# Patient Record
Sex: Female | Born: 1980 | Race: White | Hispanic: No | Marital: Single | Smoking: Former smoker
Health system: Southern US, Community
[De-identification: ages and names within clinical notes are randomized; demographics above are authoritative.]

## PROBLEM LIST (undated history)

## (undated) DIAGNOSIS — K219 Gastro-esophageal reflux disease without esophagitis: Secondary | ICD-10-CM

## (undated) DIAGNOSIS — I10 Essential (primary) hypertension: Secondary | ICD-10-CM

## (undated) DIAGNOSIS — D649 Anemia, unspecified: Secondary | ICD-10-CM

## (undated) DIAGNOSIS — L732 Hidradenitis suppurativa: Secondary | ICD-10-CM

## (undated) HISTORY — DX: Essential (primary) hypertension: I10

## (undated) HISTORY — DX: Anemia, unspecified: D64.9

## (undated) HISTORY — PX: NO PAST SURGERIES: SHX2092

## (undated) HISTORY — DX: Hidradenitis suppurativa: L73.2

---

## 2005-04-13 ENCOUNTER — Inpatient Hospital Stay (HOSPITAL_COMMUNITY): Admission: RE | Admit: 2005-04-13 | Discharge: 2005-04-18 | Payer: Self-pay | Admitting: Psychiatry

## 2005-04-14 ENCOUNTER — Ambulatory Visit: Payer: Self-pay | Admitting: Psychiatry

## 2005-04-16 ENCOUNTER — Ambulatory Visit: Payer: Self-pay | Admitting: Psychiatry

## 2012-04-10 ENCOUNTER — Encounter: Payer: Self-pay | Admitting: *Deleted

## 2012-04-10 ENCOUNTER — Other Ambulatory Visit: Payer: Self-pay | Admitting: *Deleted

## 2012-04-10 MED ORDER — ETONOGESTREL-ETHINYL ESTRADIOL 0.12-0.015 MG/24HR VA RING: 1.0000 | VAGINAL_RING | VAGINAL | Status: DC

## 2012-04-16 ENCOUNTER — Telehealth: Payer: Self-pay | Admitting: Certified Nurse Midwife

## 2012-04-16 NOTE — Telephone Encounter (Signed)
Pt is returning Fannie Knee call.

## 2012-04-17 MED ORDER — ETONOGESTREL-ETHINYL ESTRADIOL 0.12-0.015 MG/24HR VA RING: 1.0000 | VAGINAL_RING | VAGINAL | Status: DC

## 2012-04-17 NOTE — Telephone Encounter (Signed)
Rx sent to [facility] for Nuvaring Vaginal Ring. sue

## 2012-04-17 NOTE — Telephone Encounter (Signed)
Left message at work # to return call regarding her prescription and insurance.

## 2012-05-16 ENCOUNTER — Encounter: Payer: Self-pay | Admitting: Certified Nurse Midwife

## 2012-05-17 ENCOUNTER — Ambulatory Visit (INDEPENDENT_AMBULATORY_CARE_PROVIDER_SITE_OTHER): Payer: BC Managed Care – PPO | Admitting: Certified Nurse Midwife

## 2012-05-17 ENCOUNTER — Encounter: Payer: Self-pay | Admitting: Certified Nurse Midwife

## 2012-05-17 VITALS — BP 102/70 | Ht 67.0 in | Wt 220.0 lb

## 2012-05-17 DIAGNOSIS — N649 Disorder of breast, unspecified: Secondary | ICD-10-CM

## 2012-05-17 DIAGNOSIS — Z01419 Encounter for gynecological examination (general) (routine) without abnormal findings: Secondary | ICD-10-CM

## 2012-05-17 MED ORDER — ETONOGESTREL-ETHINYL ESTRADIOL 0.12-0.015 MG/24HR VA RING: VAGINAL_RING | VAGINAL | Status: DC

## 2012-05-17 NOTE — Progress Notes (Signed)
32 y.o. G0P0000 Single Caucasian Fe here for annual exam. Periods normal.  Contraception working well. No health issues today.   Patient's last menstrual period was 05/01/2012.          Sexually active: no  The current method of family planning is NuvaRing vaginal inserts.    Exercising: no  exercise Smoker:  no  Health Maintenance: Pap:  05/17/11 neg HPVHR neg MMG:  none Colonoscopy:  none BMD:   none TDaP:  2004 Labs: none Self breast exams: not done   reports that she has quit smoking. She does not have any smokeless tobacco history on file. She reports that she does not drink alcohol or use illicit drugs.  Past Medical History  Diagnosis Date  . Anemia     History reviewed. No pertinent past surgical history.  Current Outpatient Prescriptions  Medication Sig Dispense Refill  . etonogestrel-ethinyl estradiol (NUVARING) 0.12-0.015 MG/24HR vaginal ring Place 1 each vaginally every 28 (twenty-eight) days. Insert vaginally and leave in place for 3 consecutive weeks, then remove for 1 week.  3 each  4  . Fexofenadine HCl (ALLEGRA PO) Take by mouth as needed.       No current facility-administered medications for this visit.    Family History  Problem Relation Age of Onset  . Hypertension Mother   . Diabetes Mother   . Depression Mother   . Depression Sister     ROS:  Pertinent items are noted in HPI.  Otherwise, a comprehensive ROS was negative.  Exam:   BP 102/70  Ht 5\' 7"  (1.702 m)  Wt 220 lb (99.791 kg)  BMI 34.45 kg/m2  LMP 05/01/2012 Height: 5\' 7"  (170.2 cm)  Ht Readings from Last 3 Encounters:  05/17/12 5\' 7"  (1.702 m)    General appearance: alert, cooperative and appears stated age Head: Normocephalic, without obvious abnormality, atraumatic Neck: no adenopathy, supple, symmetrical, trachea midline and thyroid normal to inspection and palpation Lungs: clear to auscultation bilaterally Breasts;:Right breast normal appearance, no masses or tenderness, No  nipple discharge or bleeding, Left breast small irregular shape ?mole or skin lesion at 6 o'clock on left breas, no nipple discharge  Dr. Farrel Gobble evaluated also Heart: regular rate and rhythm Abdomen: soft, non-tender; no masses,  no organomegaly Extremities: extremities normal, atraumatic, no cyanosis or edema Skin: Skin color, texture, turgor normal. No rashes or lesions Lymph nodes: Cervical, supraclavicular, and axillary nodes normal. No abnormal inguinal nodes palpated Neurologic: Grossly normal   Pelvic: External genitalia:  no lesions              Urethra:  normal appearing urethra with no masses, tenderness or lesions              Bartholin's and Skene's: normal                 Vagina: normal appearing vagina with normal color and discharge, no lesions              Cervix: no lesions and normal, non tender              Pap taken: no Bimanual Exam:  Uterus:  normal size, contour, position, consistency, mobility, non-tender              Adnexa: normal adnexa and no mass, fullness, tenderness               Rectovaginal: Confirms               Anus:  normal sphincter tone, no lesions  A:  Well Woman with normal exam Contraception: Nuvaring desired Left breast skin lesion   P: Reviewed health and wellness pertinent to exam   Pap smear as per guidelines  Rx Nuvaring see order Discussed finding and need for Dermatology appointment per recommendation..   Patient request help with this will make referral Dr Farrel Gobble agreeable   return annually or prn  An After Visit Summary was printed and given to the patient.  Reviewed, TL

## 2012-05-17 NOTE — Patient Instructions (Addendum)

## 2012-05-28 ENCOUNTER — Other Ambulatory Visit: Payer: Self-pay | Admitting: Gynecology

## 2012-05-28 ENCOUNTER — Telehealth: Payer: Self-pay | Admitting: *Deleted

## 2012-05-28 DIAGNOSIS — N649 Disorder of breast, unspecified: Secondary | ICD-10-CM

## 2012-05-28 NOTE — Telephone Encounter (Signed)
LM on VM confirming name about appt 06-06-12 at 11:15 arriving at 11:00 to see Alisia Ferrari, PA at [facility]. Gave phone number to call and reschedule if necessary. Pt to call if any questions.

## 2012-05-28 NOTE — Telephone Encounter (Signed)
Referral filled out, please inform

## 2012-05-28 NOTE — Telephone Encounter (Signed)
PATIENT CALLED TODAY TO ASK ABOUT APPOINTMENT TO DERMATOLOGY. SEE OV NOTE 05/17/2012. PLEASE ADVISE. SUE

## 2013-04-12 ENCOUNTER — Other Ambulatory Visit: Payer: Self-pay | Admitting: Obstetrics & Gynecology

## 2013-05-20 ENCOUNTER — Ambulatory Visit (INDEPENDENT_AMBULATORY_CARE_PROVIDER_SITE_OTHER): Payer: BC Managed Care – PPO | Admitting: Certified Nurse Midwife

## 2013-05-20 ENCOUNTER — Encounter: Payer: Self-pay | Admitting: Certified Nurse Midwife

## 2013-05-20 VITALS — BP 110/68 | HR 68 | Resp 16 | Ht 67.25 in | Wt 217.0 lb

## 2013-05-20 DIAGNOSIS — Z0000 Encounter for general adult medical examination without abnormal findings: Secondary | ICD-10-CM

## 2013-05-20 DIAGNOSIS — Z309 Encounter for contraceptive management, unspecified: Secondary | ICD-10-CM

## 2013-05-20 DIAGNOSIS — Z124 Encounter for screening for malignant neoplasm of cervix: Secondary | ICD-10-CM

## 2013-05-20 DIAGNOSIS — Z23 Encounter for immunization: Secondary | ICD-10-CM

## 2013-05-20 DIAGNOSIS — Z01419 Encounter for gynecological examination (general) (routine) without abnormal findings: Secondary | ICD-10-CM

## 2013-05-20 LAB — CBC
HCT: 37.1 % (ref 36.0–46.0)
Hemoglobin: 12.6 g/dL (ref 12.0–15.0)
MCH: 26.7 pg (ref 26.0–34.0)
MCHC: 34 g/dL (ref 30.0–36.0)
MCV: 78.6 fL (ref 78.0–100.0)
PLATELETS: 287 10*3/uL (ref 150–400)
RBC: 4.72 MIL/uL (ref 3.87–5.11)
RDW: 13.8 % (ref 11.5–15.5)
WBC: 7.9 10*3/uL (ref 4.0–10.5)

## 2013-05-20 MED ORDER — ETONOGESTREL-ETHINYL ESTRADIOL 0.12-0.015 MG/24HR VA RING: VAGINAL_RING | VAGINAL | Status: DC

## 2013-05-20 NOTE — Patient Instructions (Signed)
General topics  Next pap or exam is  due in 1 year Take a Women's multivitamin Take 1200 mg. of calcium daily - prefer dietary If any concerns in interim to call back  Breast Self-Awareness Practicing breast self-awareness may pick up problems early, prevent significant medical complications, and possibly save your life. By practicing breast self-awareness, you can become familiar with how your breasts look and feel and if your breasts are changing. This allows you to notice changes early. It can also offer you some reassurance that your breast health is good. One way to learn what is normal for your breasts and whether your breasts are changing is to do a breast self-exam. If you find a lump or something that was not present in the past, it is best to contact your caregiver right away. Other findings that should be evaluated by your caregiver include nipple discharge, especially if it is bloody; skin changes or reddening; areas where the skin seems to be pulled in (retracted); or new lumps and bumps. Breast pain is seldom associated with cancer (malignancy), but should also be evaluated by a caregiver. BREAST SELF-EXAM The best time to examine your breasts is 5 7 days after your menstrual period is over.  ExitCare Patient Information 2013 [employer].   Exercise to Stay Healthy Exercise helps you become and stay healthy. EXERCISE IDEAS AND TIPS Choose exercises that:  You enjoy.  Fit into your day. You do not need to exercise really hard to be healthy. You can do exercises at a slow or medium level and stay healthy. You can:  Stretch before and after working out.  Try yoga, Pilates, or tai chi.  Lift weights.  Walk fast, swim, jog, run, climb stairs, bicycle, dance, or rollerskate.  Take aerobic classes. Exercises that burn about 150 calories:  Running 1  miles in 15 minutes.  Playing volleyball for 45 to 60 minutes.  Washing and waxing a car for 45 to 60  minutes.  Playing touch football for 45 minutes.  Walking 1  miles in 35 minutes.  Pushing a stroller 1  miles in 30 minutes.  Playing basketball for 30 minutes.  Raking leaves for 30 minutes.  Bicycling 5 miles in 30 minutes.  Walking 2 miles in 30 minutes.  Dancing for 30 minutes.  Shoveling snow for 15 minutes.  Swimming laps for 20 minutes.  Walking up stairs for 15 minutes.  Bicycling 4 miles in 15 minutes.  Gardening for 30 to 45 minutes.  Jumping rope for 15 minutes.  Washing windows or floors for 45 to 60 minutes. Document Released: 02/04/2010 Document Revised: 03/27/2011 Document Reviewed: 02/04/2010 ExitCare Patient Information 2013 [employer].   Other topics ( that may be useful information):    Sexually Transmitted Disease Sexually transmitted disease (STD) refers to any infection that is passed from person to person during sexual activity. This may happen by way of saliva, semen, blood, vaginal mucus, or urine. Common STDs include:  Gonorrhea.  Chlamydia.  Syphilis.  HIV/AIDS.  Genital herpes.  Hepatitis B and C.  Trichomonas.  Human papillomavirus (HPV).  Pubic lice. CAUSES  An STD may be spread by bacteria, virus, or parasite. A person can get an STD by:  Sexual intercourse with an infected person.  Sharing sex toys with an infected person.  Sharing needles with an infected person.  Having intimate contact with the genitals, mouth, or rectal areas of an infected person. SYMPTOMS  Some people may not have any symptoms, but   they can still pass the infection to others. Different STDs have different symptoms. Symptoms include:  Painful or bloody urination.  Pain in the pelvis, abdomen, vagina, anus, throat, or eyes.  Skin rash, itching, irritation, growths, or sores (lesions). These usually occur in the genital or anal area.  Abnormal vaginal discharge.  Penile discharge in men.  Soft, flesh-colored skin growths in the  genital or anal area.  Fever.  Pain or bleeding during sexual intercourse.  Swollen glands in the groin area.  Yellow skin and eyes (jaundice). This is seen with hepatitis. DIAGNOSIS  To make a diagnosis, your caregiver may:  Take a medical history.  Perform a physical exam.  Take a specimen (culture) to be examined.  Examine a sample of discharge under a microscope.  Perform blood test TREATMENT   Chlamydia, gonorrhea, trichomonas, and syphilis can be cured with antibiotic medicine.  Genital herpes, hepatitis, and HIV can be treated, but not cured, with prescribed medicines. The medicines will lessen the symptoms.  Genital warts from HPV can be treated with medicine or by freezing, burning (electrocautery), or surgery. Warts may come back.  HPV is a virus and cannot be cured with medicine or surgery.However, abnormal areas may be followed very closely by your caregiver and may be removed from the cervix, vagina, or vulva through office procedures or surgery. If your diagnosis is confirmed, your recent sexual partners need treatment. This is true even if they are symptom-free or have a negative culture or evaluation. They should not have sex until their caregiver says it is okay. HOME CARE INSTRUCTIONS  All sexual partners should be informed, tested, and treated for all STDs.  Take your antibiotics as directed. Finish them even if you start to feel better.  Only take over-the-counter or prescription medicines for pain, discomfort, or fever as directed by your caregiver.  Rest.  Eat a balanced diet and drink enough fluids to keep your urine clear or pale yellow.  Do not have sex until treatment is completed and you have followed up with your caregiver. STDs should be checked after treatment.  Keep all follow-up appointments, Pap tests, and blood tests as directed by your caregiver.  Only use latex condoms and water-soluble lubricants during sexual activity. Do not use  petroleum jelly or oils.  Avoid alcohol and illegal drugs.  Get vaccinated for HPV and hepatitis. If you have not received these vaccines in the past, talk to your caregiver about whether one or both might be right for you.  Avoid risky sex practices that can break the skin. The only way to avoid getting an STD is to avoid all sexual activity.Latex condoms and dental dams (for oral sex) will help lessen the risk of getting an STD, but will not completely eliminate the risk. SEEK MEDICAL CARE IF:   You have a fever.  You have any new or worsening symptoms. Document Released: 03/25/2002 Document Revised: 03/27/2011 Document Reviewed: 04/01/2010 [facility] Patient Information 2013 [employer].    Domestic Abuse You are being battered or abused if someone close to you hits, pushes, or physically hurts you in any way. You also are being abused if you are forced into activities. You are being sexually abused if you are forced to have sexual contact of any kind. You are being emotionally abused if you are made to feel worthless or if you are constantly threatened. It is important to remember that help is available. No one has the right to abuse you. PREVENTION OF FURTHER  ABUSE  Learn the warning signs of danger. This varies with situations but may include: the use of alcohol, threats, isolation from friends and family, or forced sexual contact. Leave if you feel that violence is going to occur.  If you are attacked or beaten, report it to the police so the abuse is documented. You do not have to press charges. The police can protect you while you or the attackers are leaving. Get the officer's name and badge number and a copy of the report.  Find someone you can trust and tell them what is happening to you: your caregiver, a nurse, clergy member, close friend or family member. Feeling ashamed is natural, but remember that you have done nothing wrong. No one deserves abuse. Document Released:  12/31/1999 Document Revised: 03/27/2011 Document Reviewed: 03/10/2010 ExitCare Patient Information 2013 [employer].    How Much is Too Much Alcohol? Drinking too much alcohol can cause injury, accidents, and health problems. These types of problems can include:   Car crashes.  Falls.  Family fighting (domestic violence).  Drowning.  Fights.  Injuries.  Burns.  Damage to certain organs.  Having a baby with birth defects. ONE DRINK CAN BE TOO MUCH WHEN YOU ARE:  Working.  Pregnant or breastfeeding.  Taking medicines. Ask your doctor.  Driving or planning to drive. If you or someone you know has a drinking problem, get help from a doctor.  Document Released: 10/29/2008 Document Revised: 03/27/2011 Document Reviewed: 10/29/2008 ExitCare Patient Information 2013 [employer].   Smoking Hazards Smoking cigarettes is extremely bad for your health. Tobacco smoke has over 200 known poisons in it. There are over 60 chemicals in tobacco smoke that cause cancer. Some of the chemicals found in cigarette smoke include:   Cyanide.  Benzene.  Formaldehyde.  Methanol (wood alcohol).  Acetylene (fuel used in welding torches).  Ammonia. Cigarette smoke also contains the poisonous gases nitrogen oxide and carbon monoxide.  Cigarette smokers have an increased risk of many serious medical problems and Smoking causes approximately:  90% of all lung cancer deaths in men.  80% of all lung cancer deaths in women.  90% of deaths from chronic obstructive lung disease. Compared with nonsmokers, smoking increases the risk of:  Coronary heart disease by 2 to 4 times.  Stroke by 2 to 4 times.  Men developing lung cancer by 23 times.  Women developing lung cancer by 13 times.  Dying from chronic obstructive lung diseases by 12 times.  . Smoking is the most preventable cause of death and disease in our society.  WHY IS SMOKING ADDICTIVE?  Nicotine is the chemical  agent in tobacco that is capable of causing addiction or dependence.  When you smoke and inhale, nicotine is absorbed rapidly into the bloodstream through your lungs. Nicotine absorbed through the lungs is capable of creating a powerful addiction. Both inhaled and non-inhaled nicotine may be addictive.  Addiction studies of cigarettes and spit tobacco show that addiction to nicotine occurs mainly during the teen years, when young people begin using tobacco products. WHAT ARE THE BENEFITS OF QUITTING?  There are many health benefits to quitting smoking.   Likelihood of developing cancer and heart disease decreases. Health improvements are seen almost immediately.  Blood pressure, pulse rate, and breathing patterns start returning to normal soon after quitting. QUITTING SMOKING   American Lung Association - 1-800-LUNGUSA  American Cancer Society - 1-800-ACS-2345 Document Released: 02/10/2004 Document Revised: 03/27/2011 Document Reviewed: 10/14/2008 ExitCare Patient Information 2013 ExitCare[employer]   LLC.   Stress Management Stress is a state of physical or mental tension that often results from changes in your life or normal routine. Some common causes of stress are:  Death of a loved one.  Injuries or severe illnesses.  Getting fired or changing jobs.  Moving into a new home. Other causes may be:  Sexual problems.  Business or financial losses.  Taking on a large debt.  Regular conflict with someone at home or at work.  Constant tiredness from lack of sleep. It is not just bad things that are stressful. It may be stressful to:  Win the lottery.  Get married.  Buy a new car. The amount of stress that can be easily tolerated varies from person to person. Changes generally cause stress, regardless of the types of change. Too much stress can affect your health. It may lead to physical or emotional problems. Too little stress (boredom) may also become stressful. SUGGESTIONS TO  REDUCE STRESS:  Talk things over with your family and friends. It often is helpful to share your concerns and worries. If you feel your problem is serious, you may want to get help from a professional counselor.  Consider your problems one at a time instead of lumping them all together. Trying to take care of everything at once may seem impossible. List all the things you need to do and then start with the most important one. Set a goal to accomplish 2 or 3 things each day. If you expect to do too many in a single day you will naturally fail, causing you to feel even more stressed.  Do not use alcohol or drugs to relieve stress. Although you may feel better for a short time, they do not remove the problems that caused the stress. They can also be habit forming.  Exercise regularly - at least 3 times per week. Physical exercise can help to relieve that "uptight" feeling and will relax you.  The shortest distance between despair and hope is often a good night's sleep.  Go to bed and get up on time allowing yourself time for appointments without being rushed.  Take a short "time-out" period from any stressful situation that occurs during the day. Close your eyes and take some deep breaths. Starting with the muscles in your face, tense them, hold it for a few seconds, then relax. Repeat this with the muscles in your neck, shoulders, hand, stomach, back and legs.  Take good care of yourself. Eat a balanced diet and get plenty of rest.  Schedule time for having fun. Take a break from your daily routine to relax. HOME CARE INSTRUCTIONS   Call if you feel overwhelmed by your problems and feel you can no longer manage them on your own.  Return immediately if you feel like hurting yourself or someone else. Document Released: 06/28/2000 Document Revised: 03/27/2011 Document Reviewed: 02/18/2007 ExitCare Patient Information 2013 [employer]Care[employer].   

## 2013-05-20 NOTE — Progress Notes (Signed)
Reviewed personally.  M. Suzanne Kemoni Ortega, MD.  

## 2013-05-20 NOTE — Progress Notes (Signed)
33 y.o. G0P0000 Single Caucasian Fe here for annual exam. Periods normal, no problems. [place] working well for cycle control. Mother type 2 diabetic, patient working on weight loss, to decrease her risk. No health issues. Busy with her "job".  Patient's last menstrual period was 05/01/2013.          Sexually active: no  The current method of family planning is NuvaRing vaginal inserts.    Exercising: no  exercise Smoker:  no  Health Maintenance: Pap: 05-17-11 neg HPV HR neg MMG:  none Colonoscopy:  none BMD:   none TDaP: 12/17/02 Labs: none Self breast exam: not done   reports that she has quit smoking. She does not have any smokeless tobacco history on file. She reports that she does not drink alcohol or use illicit drugs.  Past Medical History  Diagnosis Date  . Anemia     History reviewed. No pertinent past surgical history.  Current Outpatient Prescriptions  Medication Sig Dispense Refill  . etonogestrel-ethinyl estradiol (NUVARING) 0.12-0.015 MG/24HR vaginal ring Place 1 each vaginally every 28 (twenty-eight) days. Insert vaginally and leave in place for 3 consecutive weeks, then remove for 1 week.      Marland Kitchen Fexofenadine HCl (ALLEGRA PO) Take by mouth as needed.      . Nutritional Supplements (JUICE PLUS FIBRE PO) Take by mouth daily.       No current facility-administered medications for this visit.    Family History  Problem Relation Age of Onset  . Hypertension Mother   . Diabetes Mother   . Depression Mother   . Depression Sister   . Cancer Maternal Aunt   . Heart attack Maternal Uncle     ROS:  Pertinent items are noted in HPI.  Otherwise, a comprehensive ROS was negative.  Exam:   BP 110/68  Pulse 68  Resp 16  Ht 5' 7.25" (1.708 m)  Wt 217 lb (98.431 kg)  BMI 33.74 kg/m2  LMP 05/01/2013 Height: 5' 7.25" (170.8 cm)  Ht Readings from Last 3 Encounters:  05/20/13 5' 7.25" (1.708 m)  05/17/12 5\' 7"  (1.702 m)    General appearance: alert, cooperative and  appears stated age Head: Normocephalic, without obvious abnormality, atraumatic Neck: no adenopathy, supple, symmetrical, trachea midline and thyroid normal to inspection and palpation and non-palpable Lungs: clear to auscultation bilaterally Breasts: normal appearance, no masses or tenderness, No nipple retraction or dimpling, No nipple discharge or bleeding, No axillary or supraclavicular adenopathy Heart: regular rate and rhythm Abdomen: soft, non-tender; no masses,  no organomegaly Extremities: extremities normal, atraumatic, no cyanosis or edema Skin: Skin color, texture, turgor normal. No rashes or lesions Lymph nodes: Cervical, supraclavicular, and axillary nodes normal. No abnormal inguinal nodes palpated Neurologic: Grossly normal   Pelvic: External genitalia:  no lesions              Urethra:  normal appearing urethra with no masses, tenderness or lesions              Bartholin's and Skene's: normal                 Vagina: normal appearing vagina with normal color and discharge, no lesions              Cervix: normal, non tender              Pap taken: yes Bimanual Exam:  Uterus:  normal size, contour, position, consistency, mobility, non-tender and anteverted  Adnexa: normal adnexa, no masses               Rectovaginal: Confirms               Anus:  normal sphincter tone, no lesions  A:  Well Woman with normal exam  History of dysmenorrhea, Nuvaring working well for cycle control, desires continuance  Screening labs  Immunization update  P:   Reviewed health and wellness pertinent to exam  Rx Nuvaring see order  Labs:CBC,Lipid panel,TSH, Hgb A1-c  Pap smear taken today with HPV reflex  Requests TDAP   counseled on breast self exam, STD prevention, adequate intake of calcium and vitamin D, diet and exercise  return annually or prn  An After Visit Summary was printed and given to the patient.

## 2013-05-21 LAB — HEMOGLOBIN A1C
Hgb A1c MFr Bld: 5.5 % (ref <5.7)
Mean Plasma Glucose: 111 mg/dL (ref <117)

## 2013-05-21 LAB — LIPID PANEL
Cholesterol: 211 mg/dL — ABNORMAL HIGH (ref 0–200)
HDL: 73 mg/dL (ref >39)
LDL CALC: 119 mg/dL — AB (ref 0–99)
Total CHOL/HDL Ratio: 2.9 Ratio
Triglycerides: 93 mg/dL (ref <150)
VLDL: 19 mg/dL (ref 0–40)

## 2013-05-21 LAB — TSH: TSH: 1.737 u[IU]/mL (ref 0.350–4.500)

## 2013-05-23 LAB — IPS PAP TEST WITH REFLEX TO HPV

## 2014-05-26 ENCOUNTER — Ambulatory Visit (INDEPENDENT_AMBULATORY_CARE_PROVIDER_SITE_OTHER): Payer: BLUE CROSS/BLUE SHIELD | Admitting: Certified Nurse Midwife

## 2014-05-26 ENCOUNTER — Encounter: Payer: Self-pay | Admitting: Certified Nurse Midwife

## 2014-05-26 VITALS — BP 110/72 | HR 70 | Resp 20 | Ht 67.25 in | Wt 239.0 lb

## 2014-05-26 DIAGNOSIS — Z0000 Encounter for general adult medical examination without abnormal findings: Secondary | ICD-10-CM | POA: Diagnosis not present

## 2014-05-26 DIAGNOSIS — Z01419 Encounter for gynecological examination (general) (routine) without abnormal findings: Secondary | ICD-10-CM

## 2014-05-26 DIAGNOSIS — Z3049 Encounter for surveillance of other contraceptives: Secondary | ICD-10-CM | POA: Diagnosis not present

## 2014-05-26 DIAGNOSIS — R899 Unspecified abnormal finding in specimens from other organs, systems and tissues: Secondary | ICD-10-CM

## 2014-05-26 DIAGNOSIS — Z124 Encounter for screening for malignant neoplasm of cervix: Secondary | ICD-10-CM | POA: Diagnosis not present

## 2014-05-26 LAB — COMPREHENSIVE METABOLIC PANEL
ALBUMIN: 3.8 g/dL (ref 3.5–5.2)
ALT: 13 U/L (ref 0–35)
AST: 9 U/L (ref 0–37)
Alkaline Phosphatase: 71 U/L (ref 39–117)
BILIRUBIN TOTAL: 0.5 mg/dL (ref 0.2–1.2)
BUN: 16 mg/dL (ref 6–23)
CO2: 22 mEq/L (ref 19–32)
Calcium: 9.4 mg/dL (ref 8.4–10.5)
Chloride: 104 mEq/L (ref 96–112)
Creat: 0.72 mg/dL (ref 0.50–1.10)
GLUCOSE: 80 mg/dL (ref 70–99)
Potassium: 4.3 mEq/L (ref 3.5–5.3)
Sodium: 140 mEq/L (ref 135–145)
Total Protein: 6.9 g/dL (ref 6.0–8.3)

## 2014-05-26 LAB — HEMOGLOBIN A1C
HEMOGLOBIN A1C: 5.6 % (ref <5.7)
MEAN PLASMA GLUCOSE: 114 mg/dL (ref <117)

## 2014-05-26 LAB — POCT URINALYSIS DIPSTICK
Bilirubin, UA: NEGATIVE
Glucose, UA: NEGATIVE
KETONES UA: NEGATIVE
Leukocytes, UA: NEGATIVE
Nitrite, UA: NEGATIVE
PH UA: 5
Protein, UA: NEGATIVE
RBC UA: NEGATIVE
UROBILINOGEN UA: NEGATIVE

## 2014-05-26 LAB — CBC
HCT: 40 % (ref 36.0–46.0)
HEMOGLOBIN: 13.1 g/dL (ref 12.0–15.0)
MCH: 26.6 pg (ref 26.0–34.0)
MCHC: 32.8 g/dL (ref 30.0–36.0)
MCV: 81.3 fL (ref 78.0–100.0)
MPV: 10.4 fL (ref 8.6–12.4)
Platelets: 319 10*3/uL (ref 150–400)
RBC: 4.92 MIL/uL (ref 3.87–5.11)
RDW: 14.4 % (ref 11.5–15.5)
WBC: 12.4 10*3/uL — ABNORMAL HIGH (ref 4.0–10.5)

## 2014-05-26 LAB — LIPID PANEL
Cholesterol: 227 mg/dL — ABNORMAL HIGH (ref 0–200)
HDL: 72 mg/dL (ref >=46)
LDL CALC: 138 mg/dL — AB (ref 0–99)
Total CHOL/HDL Ratio: 3.2 Ratio
Triglycerides: 85 mg/dL (ref <150)
VLDL: 17 mg/dL (ref 0–40)

## 2014-05-26 MED ORDER — ETONOGESTREL-ETHINYL ESTRADIOL 0.12-0.015 MG/24HR VA RING: VAGINAL_RING | VAGINAL | Status: DC

## 2014-05-26 NOTE — Patient Instructions (Signed)
EXERCISE AND DIET:  We recommended that you start or continue a regular exercise program for good health. Regular exercise means any activity that makes your heart beat faster and makes you sweat.  We recommend exercising at least 30 minutes per day at least 3 days a week, preferably 4 or 5.  We also recommend a diet low in fat and sugar.  Inactivity, poor dietary choices and obesity can cause diabetes, heart attack, stroke, and kidney damage, among others.    ALCOHOL AND SMOKING:  Women should limit their alcohol intake to no more than 7 drinks/beers/glasses of wine (combined, not each!) per week. Moderation of alcohol intake to this level decreases your risk of breast cancer and liver damage. And of course, no recreational drugs are part of a healthy lifestyle.  And absolutely no smoking or even second hand smoke. Most people know smoking can cause heart and lung diseases, but did you know it also contributes to weakening of your bones? Aging of your skin?  Yellowing of your teeth and nails?  CALCIUM AND VITAMIN D:  Adequate intake of calcium and Vitamin D are recommended.  The recommendations for exact amounts of these supplements seem to change often, but generally speaking 600 mg of calcium (either carbonate or citrate) and 800 units of Vitamin D per day seems prudent. Certain women may benefit from higher intake of Vitamin D.  If you are among these women, your doctor will have told you during your visit.    PAP SMEARS:  Pap smears, to check for cervical cancer or precancers,  have traditionally been done yearly, although recent scientific advances have shown that most women can have pap smears less often.  However, every woman still should have a physical exam from her gynecologist every year. It will include a breast check, inspection of the vulva and vagina to check for abnormal growths or skin changes, a visual exam of the cervix, and then an exam to evaluate the size and shape of the uterus and  ovaries.  And after 36 years of age, a rectal exam is indicated to check for rectal cancers. We will also provide age appropriate advice regarding health maintenance, like when you should have certain vaccines, screening for sexually transmitted diseases, bone density testing, colonoscopy, mammograms, etc.   MAMMOGRAMS:  All women over 40 years old should have a yearly mammogram. Many facilities now offer a "3D" mammogram, which may cost around $50 extra out of pocket. If possible,  we recommend you accept the option to have the 3D mammogram performed.  It both reduces the number of women who will be called back for extra views which then turn out to be normal, and it is better than the routine mammogram at detecting truly abnormal areas.    COLONOSCOPY:  Colonoscopy to screen for colon cancer is recommended for all women at age 50.  We know, you hate the idea of the prep.  We agree, BUT, having colon cancer and not knowing it is worse!!  Colon cancer so often starts as a polyp that can be seen and removed at colonscopy, which can quite literally save your life!  And if your first colonoscopy is normal and you have no family history of colon cancer, most women don't have to have it again for 10 years.  Once every ten years, you can do something that may end up saving your life, right?  We will be happy to help you get it scheduled when you are ready.    Be sure to check your insurance coverage so you understand how much it will cost.  It may be covered as a preventative service at no cost, but you should check your particular policy.     Exercise to Lose Weight Exercise and a healthy diet may help you lose weight. Your doctor may suggest specific exercises. EXERCISE IDEAS AND TIPS  Choose low-cost things you enjoy doing, such as walking, bicycling, or exercising to workout videos.  Take stairs instead of the elevator.  Walk during your lunch break.  Park your car further away from work or  school.  Go to a gym or an exercise class.  Start with 5 to 10 minutes of exercise each day. Build up to 30 minutes of exercise 4 to 6 days a week.  Wear shoes with good support and comfortable clothes.  Stretch before and after working out.  Work out until you breathe harder and your heart beats faster.  Drink extra water when you exercise.  Do not do so much that you hurt yourself, feel dizzy, or get very short of breath. Exercises that burn about 150 calories:  Running 1  miles in 15 minutes.  Playing volleyball for 45 to 60 minutes.  Washing and waxing a car for 45 to 60 minutes.  Playing touch football for 45 minutes.  Walking 1  miles in 35 minutes.  Pushing a stroller 1  miles in 30 minutes.  Playing basketball for 30 minutes.  Raking leaves for 30 minutes.  Bicycling 5 miles in 30 minutes.  Walking 2 miles in 30 minutes.  Dancing for 30 minutes.  Shoveling snow for 15 minutes.  Swimming laps for 20 minutes.  Walking up stairs for 15 minutes.  Bicycling 4 miles in 15 minutes.  Gardening for 30 to 45 minutes.  Jumping rope for 15 minutes.  Washing windows or floors for 45 to 60 minutes. Document Released: 02/04/2010 Document Revised: 03/27/2011 Document Reviewed: 02/04/2010 [facility] Patient Information 2015 [place], [place]. This information is not intended to replace advice given to you by your health care provider. Make sure you discuss any questions you have with your health care provider.

## 2014-05-26 NOTE — Progress Notes (Signed)
34 y.o. G0P0000 Single  Caucasian Fe here for annual exam. Contraception working well. Periods now 3 days from 11 days duration" loves Nuvaring". Never sexually active.  Sees Urgent care if needed. No health issue in past year. Patient knows she has gained weight and plans to go on [place] program with her mom. No health issues today.  Patient's last menstrual period was 05/01/2014.          Sexually active: No.  The current method of family planning is NuvaRing vaginal inserts.    Exercising: No.  exercise Smoker:  no  Health Maintenance: Pap: 05-20-13 neg MMG:  none Colonoscopy:  none BMD:   none TDaP:  2015 Labs: not done   reports that she has quit smoking. She does not have any smokeless tobacco history on file. She reports that she does not drink alcohol or use illicit drugs.  Past Medical History  Diagnosis Date  . Anemia     History reviewed. No pertinent past surgical history.  Current Outpatient Prescriptions  Medication Sig Dispense Refill  . etonogestrel-ethinyl estradiol (NUVARING) 0.12-0.015 MG/24HR vaginal ring Insert vaginally and leave in place for 3 consecutive weeks, then remove for 1 week. 3 each 4  . UNABLE TO FIND daily. Allergy nasal spray     No current facility-administered medications for this visit.    Family History  Problem Relation Age of Onset  . Hypertension Mother   . Diabetes Mother   . Depression Mother   . Depression Sister   . Cancer Maternal Aunt   . Heart attack Maternal Uncle     ROS:  Pertinent items are noted in HPI.  Otherwise, a comprehensive ROS was negative.  Exam:   BP 110/72 mmHg  Pulse 70  Resp 20  Ht 5' 7.25" (1.708 m)  Wt 239 lb (108.41 kg)  BMI 37.16 kg/m2  LMP 05/01/2014 Height: 5' 7.25" (170.8 cm) Ht Readings from Last 3 Encounters:  05/26/14 5' 7.25" (1.708 m)  05/20/13 5' 7.25" (1.708 m)  05/17/12 5\' 7"  (1.702 m)    General appearance: alert, cooperative and appears stated age Head: Normocephalic,  without obvious abnormality, atraumatic Neck: no adenopathy, supple, symmetrical, trachea midline and thyroid normal to inspection and palpation Lungs: clear to auscultation bilaterally Breasts: normal appearance, no masses or tenderness, No nipple retraction or dimpling, No nipple discharge or bleeding, No axillary or supraclavicular adenopathy Heart: regular rate and rhythm Abdomen: soft, non-tender; no masses,  no organomegaly Extremities: extremities normal, atraumatic, no cyanosis or edema Skin: Skin color, texture, turgor normal. No rashes or lesions Lymph nodes: Cervical, supraclavicular, and axillary nodes normal. No abnormal inguinal nodes palpated Neurologic: Grossly normal   Pelvic: External genitalia:  no lesions              Urethra:  normal appearing urethra with no masses, tenderness or lesions              Bartholin's and Skene's: normal                 Vagina: normal appearing vagina with normal color and discharge, no lesions              Cervix: normal,non tender, no lesions              Pap taken: Yes.   Bimanual Exam:  Uterus:  normal size, contour, position, consistency, mobility, non-tender              Adnexa: normal adnexa and no mass,  fullness, tenderness               Rectovaginal: Confirms               Anus:  normal sphincter tone, no lesions  Chaperone present: Yes  A:  Well Woman with normal exam  Contraception Nuvaring desired  Fasting labs  Morbid obesity, starting on Nutrasystem with mother  P:   Reviewed health and wellness pertinent to exam  Rx Nuvaring see order  Lab CMP, Lipid panel, Hgb A1-c, CBC, Vitamin D  Stressed importance of weight loss and prevention of other health issues.  Pap smear taken today with HPVHR   counseled on breast self exam, adequate intake of calcium and vitamin D, diet and exercise  return annually or prn  An After Visit Summary was printed and given to the patient.

## 2014-05-27 ENCOUNTER — Other Ambulatory Visit: Payer: Self-pay | Admitting: Certified Nurse Midwife

## 2014-05-27 LAB — VITAMIN D 25 HYDROXY (VIT D DEFICIENCY, FRACTURES): VIT D 25 HYDROXY: 21 ng/mL — AB (ref 30–100)

## 2014-05-27 NOTE — Telephone Encounter (Signed)
05/26/14 #3 packs/4 rfs was sent to [facility]- Denied.

## 2014-06-01 LAB — IPS PAP TEST WITH HPV

## 2014-06-01 NOTE — Progress Notes (Signed)
Reviewed personally.  M. Suzanne Velvia Mehrer, MD.  

## 2014-06-01 NOTE — Addendum Note (Signed)
Addended by: Regina Eck on: 06/01/2014 06:05 PM   Modules accepted: Miquel Dunn

## 2014-06-04 ENCOUNTER — Other Ambulatory Visit (INDEPENDENT_AMBULATORY_CARE_PROVIDER_SITE_OTHER): Payer: BLUE CROSS/BLUE SHIELD

## 2014-06-04 DIAGNOSIS — R899 Unspecified abnormal finding in specimens from other organs, systems and tissues: Secondary | ICD-10-CM

## 2014-06-05 LAB — CBC WITH DIFFERENTIAL/PLATELET
BASOS ABS: 0 10*3/uL (ref 0.0–0.1)
BASOS PCT: 0 % (ref 0–1)
EOS ABS: 0.1 10*3/uL (ref 0.0–0.7)
Eosinophils Relative: 1 % (ref 0–5)
HEMATOCRIT: 35.4 % — AB (ref 36.0–46.0)
Hemoglobin: 11.7 g/dL — ABNORMAL LOW (ref 12.0–15.0)
Lymphocytes Relative: 23 % (ref 12–46)
Lymphs Abs: 2.4 10*3/uL (ref 0.7–4.0)
MCH: 26.1 pg (ref 26.0–34.0)
MCHC: 33.1 g/dL (ref 30.0–36.0)
MCV: 79 fL (ref 78.0–100.0)
MONO ABS: 0.6 10*3/uL (ref 0.1–1.0)
MONOS PCT: 6 % (ref 3–12)
MPV: 10.5 fL (ref 8.6–12.4)
NEUTROS ABS: 7.4 10*3/uL (ref 1.7–7.7)
Neutrophils Relative %: 70 % (ref 43–77)
PLATELETS: 279 10*3/uL (ref 150–400)
RBC: 4.48 MIL/uL (ref 3.87–5.11)
RDW: 14.2 % (ref 11.5–15.5)
WBC: 10.6 10*3/uL — AB (ref 4.0–10.5)

## 2014-06-08 ENCOUNTER — Other Ambulatory Visit: Payer: Self-pay | Admitting: Certified Nurse Midwife

## 2014-06-08 DIAGNOSIS — R899 Unspecified abnormal finding in specimens from other organs, systems and tissues: Secondary | ICD-10-CM

## 2014-07-08 ENCOUNTER — Other Ambulatory Visit (INDEPENDENT_AMBULATORY_CARE_PROVIDER_SITE_OTHER): Payer: BLUE CROSS/BLUE SHIELD

## 2014-07-08 DIAGNOSIS — R899 Unspecified abnormal finding in specimens from other organs, systems and tissues: Secondary | ICD-10-CM

## 2014-07-08 LAB — CBC WITH DIFFERENTIAL/PLATELET
BASOS PCT: 0 % (ref 0–1)
Basophils Absolute: 0 10*3/uL (ref 0.0–0.1)
EOS ABS: 0.1 10*3/uL (ref 0.0–0.7)
EOS PCT: 1 % (ref 0–5)
HEMATOCRIT: 37.7 % (ref 36.0–46.0)
Hemoglobin: 12.3 g/dL (ref 12.0–15.0)
Lymphocytes Relative: 25 % (ref 12–46)
Lymphs Abs: 2.8 10*3/uL (ref 0.7–4.0)
MCH: 26.4 pg (ref 26.0–34.0)
MCHC: 32.6 g/dL (ref 30.0–36.0)
MCV: 80.9 fL (ref 78.0–100.0)
MONOS PCT: 7 % (ref 3–12)
MPV: 11.2 fL (ref 8.6–12.4)
Monocytes Absolute: 0.8 10*3/uL (ref 0.1–1.0)
NEUTROS PCT: 67 % (ref 43–77)
Neutro Abs: 7.4 10*3/uL (ref 1.7–7.7)
Platelets: 292 10*3/uL (ref 150–400)
RBC: 4.66 MIL/uL (ref 3.87–5.11)
RDW: 14.5 % (ref 11.5–15.5)
WBC: 11.1 10*3/uL — ABNORMAL HIGH (ref 4.0–10.5)

## 2014-07-17 ENCOUNTER — Telehealth: Payer: Self-pay

## 2014-07-17 ENCOUNTER — Other Ambulatory Visit: Payer: Self-pay | Admitting: Certified Nurse Midwife

## 2014-07-17 DIAGNOSIS — R899 Unspecified abnormal finding in specimens from other organs, systems and tissues: Secondary | ICD-10-CM

## 2014-07-17 NOTE — Telephone Encounter (Signed)
Patient notified of results. See lab 

## 2014-07-17 NOTE — Telephone Encounter (Signed)
-----   Message from Regina Eck, CNM sent at 07/17/2014 12:56 PM EDT ----- Notify patient that WBC is still slightly elevated but differential is normal. Needs repeat in 6 months. Order in

## 2014-07-17 NOTE — Telephone Encounter (Signed)
Lmtcb//jj

## 2014-12-03 ENCOUNTER — Other Ambulatory Visit: Payer: Self-pay | Admitting: Certified Nurse Midwife

## 2014-12-03 ENCOUNTER — Other Ambulatory Visit (INDEPENDENT_AMBULATORY_CARE_PROVIDER_SITE_OTHER): Payer: BLUE CROSS/BLUE SHIELD

## 2014-12-03 DIAGNOSIS — R899 Unspecified abnormal finding in specimens from other organs, systems and tissues: Secondary | ICD-10-CM

## 2014-12-03 LAB — LIPID PANEL
CHOL/HDL RATIO: 3 ratio (ref <=5.0)
Cholesterol: 206 mg/dL — ABNORMAL HIGH (ref 125–200)
HDL: 68 mg/dL (ref >=46)
LDL CALC: 125 mg/dL (ref <130)
Triglycerides: 65 mg/dL (ref <150)
VLDL: 13 mg/dL (ref <30)

## 2014-12-14 ENCOUNTER — Telehealth: Payer: Self-pay | Admitting: Certified Nurse Midwife

## 2014-12-14 NOTE — Telephone Encounter (Signed)
Spoke with patient. Advised of results as seen below form Melvia Heaps CNM. Patient is agreeable and verbalizes understanding.  Notes Recorded by Susy Manor, CMA on 12/03/2014 at 4:58 PM Released to mychart Notes Recorded by Regina Eck, CNM on 12/03/2014 at 4:15 PM Notify patient that cholesterol is better at 206 from 227. Continue work on diet and weight loss. Recheck with aex.  Routing to provider for final review. Patient agreeable to disposition. Will close encounter.

## 2014-12-14 NOTE — Telephone Encounter (Signed)
Patient is calling for recent lab results from 12/03/14.

## 2015-01-19 ENCOUNTER — Other Ambulatory Visit (INDEPENDENT_AMBULATORY_CARE_PROVIDER_SITE_OTHER): Payer: BLUE CROSS/BLUE SHIELD

## 2015-01-19 DIAGNOSIS — R899 Unspecified abnormal finding in specimens from other organs, systems and tissues: Secondary | ICD-10-CM

## 2015-01-20 LAB — CBC WITH DIFFERENTIAL/PLATELET
Basophils Absolute: 0 10*3/uL (ref 0.0–0.1)
Basophils Relative: 0 % (ref 0–1)
EOS ABS: 0.1 10*3/uL (ref 0.0–0.7)
EOS PCT: 1 % (ref 0–5)
HCT: 37.8 % (ref 36.0–46.0)
Hemoglobin: 13.3 g/dL (ref 12.0–15.0)
LYMPHS ABS: 3 10*3/uL (ref 0.7–4.0)
Lymphocytes Relative: 25 % (ref 12–46)
MCH: 28.1 pg (ref 26.0–34.0)
MCHC: 35.2 g/dL (ref 30.0–36.0)
MCV: 79.9 fL (ref 78.0–100.0)
MONO ABS: 0.7 10*3/uL (ref 0.1–1.0)
MONOS PCT: 6 % (ref 3–12)
MPV: 10.4 fL (ref 8.6–12.4)
Neutro Abs: 8.1 10*3/uL — ABNORMAL HIGH (ref 1.7–7.7)
Neutrophils Relative %: 68 % (ref 43–77)
PLATELETS: 289 10*3/uL (ref 150–400)
RBC: 4.73 MIL/uL (ref 3.87–5.11)
RDW: 13.6 % (ref 11.5–15.5)
WBC: 11.9 10*3/uL — ABNORMAL HIGH (ref 4.0–10.5)

## 2015-01-28 ENCOUNTER — Telehealth: Payer: Self-pay

## 2015-01-28 NOTE — Telephone Encounter (Signed)
Spoke with patient. Advised of results as seen below from [place]. Patient is agreeable and will call to schedule an appointment with her PCP Dr.Street. Results faxed to [facility] at [phone] with cover sheet and confirmation. Will return call if she needs any assistance.  Routing to provider for final review. Patient agreeable to disposition. Will close encounter.

## 2015-01-28 NOTE — Telephone Encounter (Signed)
Left message to call Amsi Grimley at [phone]. 

## 2015-01-28 NOTE — Telephone Encounter (Signed)
-----   Message from Regina Eck, CNM sent at 01/27/2015 11:07 AM EST ----- Notify patient that WBC still remains borderline elevation. Discussed with Dr. Talbert Nan, recommends PCP referral for management and follow up. Patient will need to be referred and copy of labs. Not sure if she has PCP.

## 2015-05-27 DIAGNOSIS — J0100 Acute maxillary sinusitis, unspecified: Secondary | ICD-10-CM | POA: Diagnosis not present

## 2015-06-01 ENCOUNTER — Encounter: Payer: Self-pay | Admitting: Certified Nurse Midwife

## 2015-06-01 ENCOUNTER — Ambulatory Visit (INDEPENDENT_AMBULATORY_CARE_PROVIDER_SITE_OTHER): Payer: BLUE CROSS/BLUE SHIELD | Admitting: Certified Nurse Midwife

## 2015-06-01 VITALS — BP 120/80 | HR 72 | Resp 16 | Ht 67.25 in | Wt 235.0 lb

## 2015-06-01 DIAGNOSIS — Z3049 Encounter for surveillance of other contraceptives: Secondary | ICD-10-CM | POA: Diagnosis not present

## 2015-06-01 DIAGNOSIS — Z0000 Encounter for general adult medical examination without abnormal findings: Secondary | ICD-10-CM

## 2015-06-01 DIAGNOSIS — Z01419 Encounter for gynecological examination (general) (routine) without abnormal findings: Secondary | ICD-10-CM | POA: Diagnosis not present

## 2015-06-01 LAB — LIPID PANEL
Cholesterol: 222 mg/dL — ABNORMAL HIGH (ref 125–200)
HDL: 65 mg/dL (ref >=46)
LDL Cholesterol: 136 mg/dL — ABNORMAL HIGH (ref <130)
Total CHOL/HDL Ratio: 3.4 Ratio (ref <=5.0)
Triglycerides: 103 mg/dL (ref <150)
VLDL: 21 mg/dL (ref <30)

## 2015-06-01 LAB — COMPREHENSIVE METABOLIC PANEL
ALK PHOS: 111 U/L (ref 33–115)
ALT: 15 U/L (ref 6–29)
AST: 12 U/L (ref 10–30)
Albumin: 4 g/dL (ref 3.6–5.1)
BUN: 13 mg/dL (ref 7–25)
CALCIUM: 9.4 mg/dL (ref 8.6–10.2)
CHLORIDE: 101 mmol/L (ref 98–110)
CO2: 25 mmol/L (ref 20–31)
Creat: 0.74 mg/dL (ref 0.50–1.10)
Glucose, Bld: 78 mg/dL (ref 65–99)
POTASSIUM: 3.7 mmol/L (ref 3.5–5.3)
SODIUM: 138 mmol/L (ref 135–146)
TOTAL PROTEIN: 7.2 g/dL (ref 6.1–8.1)
Total Bilirubin: 0.5 mg/dL (ref 0.2–1.2)

## 2015-06-01 MED ORDER — ETONOGESTREL-ETHINYL ESTRADIOL 0.12-0.015 MG/24HR VA RING: VAGINAL_RING | VAGINAL | Status: DC

## 2015-06-01 NOTE — Progress Notes (Signed)
Reviewed personally.  M. Suzanne Yolanda Dockendorf, MD.  

## 2015-06-01 NOTE — Progress Notes (Signed)
35 y.o. G0P0000 Single  Caucasian Fe here for annual exam. Periods normal no issues. Contraception working well. Periods now light instead of previous heavy periods. Cramping is so much less. Very pleased with use. Desires continuance. Sees [place] PCP for hypertension management and aex, no labs. Recent Urgent care visit for sinus infection, on Augmentin now. Due screening labs today. Father recently diagnosed with colon cancer and in chemotherapy now. No other health issues now.  Patient's last menstrual period was 05/08/2015.          Sexually active: No.  The current method of family planning is NuvaRing vaginal inserts.    Exercising: No.  exercise Smoker:  no  Health Maintenance: Pap:  05-28-14 neg HPV HR neg MMG:  none Colonoscopy:  none BMD:   none TDaP:  2015 Shingles: no Pneumonia: no Hep C and HIV: yrs ago Labs: none Self breast exam: not done   reports that she has quit smoking. She does not have any smokeless tobacco history on file. She reports that she does not drink alcohol or use illicit drugs.  Past Medical History  Diagnosis Date  . Anemia     History reviewed. No pertinent past surgical history.  Current Outpatient Prescriptions  Medication Sig Dispense Refill  . amoxicillin-clavulanate (AUGMENTIN) 875-125 MG tablet     . etonogestrel-ethinyl estradiol (NUVARING) 0.12-0.015 MG/24HR vaginal ring Insert vaginally and leave in place for 3 consecutive weeks, then remove for 1 week. 3 each 4  . fexofenadine (ALLEGRA) 180 MG tablet Take 180 mg by mouth daily.    . fluticasone (FLONASE) 50 MCG/ACT nasal spray Place into both nostrils daily.    . hydrochlorothiazide (HYDRODIURIL) 25 MG tablet     . losartan (COZAAR) 50 MG tablet      No current facility-administered medications for this visit.    Family History  Problem Relation Age of Onset  . Hypertension Mother   . Diabetes Mother   . Depression Mother   . Depression Sister   . Cancer Maternal Aunt   .  Heart attack Maternal Uncle     ROS:  Pertinent items are noted in HPI.  Otherwise, a comprehensive ROS was negative.  Exam:   BP 120/80 mmHg  Pulse 72  Resp 16  Ht 5' 7.25" (1.708 m)  Wt 235 lb (106.595 kg)  BMI 36.54 kg/m2  LMP 05/08/2015 Height: 5' 7.25" (170.8 cm) Ht Readings from Last 3 Encounters:  06/01/15 5' 7.25" (1.708 m)  05/26/14 5' 7.25" (1.708 m)  05/20/13 5' 7.25" (1.708 m)    General appearance: alert, cooperative and appears stated age Head: Normocephalic, without obvious abnormality, atraumatic Neck: no adenopathy, supple, symmetrical, trachea midline and thyroid normal to inspection and palpation Lungs: clear to auscultation bilaterally, no rales or rhonchi heard Breasts: normal appearance, no masses or tenderness, No nipple retraction or dimpling, No nipple discharge or bleeding, No axillary or supraclavicular adenopathy Heart: regular rate and rhythm Abdomen: soft, non-tender; no masses,  no organomegaly Extremities: extremities normal, atraumatic, no cyanosis or edema Skin: Skin color, texture, turgor normal. No rashes or lesions Lymph nodes: Cervical, supraclavicular, and axillary nodes normal. No abnormal inguinal nodes palpated Neurologic: Grossly normal   Pelvic: External genitalia:  no lesions              Urethra:  normal appearing urethra with no masses, tenderness or lesions              Bartholin's and Skene's: normal  Vagina: normal appearing vagina with normal color and discharge, no lesions              Cervix: no cervical motion tenderness, no lesions and nulliparous appearance              Pap taken: No. Bimanual Exam:  Uterus:  normal size, contour, position, consistency, mobility, non-tender              Adnexa: normal adnexa and no mass, fullness, tenderness               Rectovaginal: Confirms               Anus:  normal sphincter tone, no lesions  Chaperone present: yes  A:  Well Woman with normal  exam  Contraception Nuvaring desired for menorrhagia and dysmenorrhea Hypertension well controlled with PCP management  Sinusitis under treatment from Urgent care  Family history of colon cancer father 72  Obesity with slight weight loss, working on reduction  P:   Reviewed health and wellness pertinent to exam  Discussed will continue Nuvaring, but if has status change with hypertension, not controlled will need to switch to Progesterone only. Patient aware and will advise if change  Rx Nuvaring  Continue follow up with PCP as indicated.  Continue to work on weight loss which will help with control of hypertension as well as decrease other health issues.  Pap smear as above not taken today   counseled on breast self exam, STD prevention, HIV risk factors and prevention, adequate intake of calcium and vitamin D, diet and exercise  return annually or prn  An After Visit Summary was printed and given to the patient.

## 2015-06-01 NOTE — Patient Instructions (Signed)

## 2015-06-02 LAB — VITAMIN D 25 HYDROXY (VIT D DEFICIENCY, FRACTURES): VIT D 25 HYDROXY: 35 ng/mL (ref 30–100)

## 2015-06-17 DIAGNOSIS — R300 Dysuria: Secondary | ICD-10-CM | POA: Diagnosis not present

## 2015-06-17 DIAGNOSIS — R319 Hematuria, unspecified: Secondary | ICD-10-CM | POA: Diagnosis not present

## 2015-06-17 DIAGNOSIS — N3090 Cystitis, unspecified without hematuria: Secondary | ICD-10-CM | POA: Diagnosis not present

## 2015-08-10 ENCOUNTER — Other Ambulatory Visit: Payer: BLUE CROSS/BLUE SHIELD

## 2015-08-10 ENCOUNTER — Other Ambulatory Visit (INDEPENDENT_AMBULATORY_CARE_PROVIDER_SITE_OTHER): Payer: BLUE CROSS/BLUE SHIELD

## 2015-08-10 ENCOUNTER — Telehealth: Payer: Self-pay | Admitting: Certified Nurse Midwife

## 2015-08-10 DIAGNOSIS — Z0000 Encounter for general adult medical examination without abnormal findings: Secondary | ICD-10-CM

## 2015-08-10 DIAGNOSIS — R899 Unspecified abnormal finding in specimens from other organs, systems and tissues: Secondary | ICD-10-CM

## 2015-08-10 LAB — CBC
HEMATOCRIT: 38.3 % (ref 35.0–45.0)
Hemoglobin: 12.9 g/dL (ref 11.7–15.5)
MCH: 26.8 pg — ABNORMAL LOW (ref 27.0–33.0)
MCHC: 33.7 g/dL (ref 32.0–36.0)
MCV: 79.5 fL — AB (ref 80.0–100.0)
MPV: 10.8 fL (ref 7.5–12.5)
PLATELETS: 332 10*3/uL (ref 140–400)
RBC: 4.82 MIL/uL (ref 3.80–5.10)
RDW: 14.1 % (ref 11.0–15.0)
WBC: 12.3 10*3/uL — ABNORMAL HIGH (ref 3.8–10.8)

## 2015-08-10 NOTE — Telephone Encounter (Signed)
Left message on voicemail letting patient know lab appointment for today has been cancelled because they were done at her visit in May.l

## 2015-08-12 ENCOUNTER — Telehealth: Payer: Self-pay

## 2015-08-12 NOTE — Telephone Encounter (Signed)
Patient returning call.

## 2015-08-12 NOTE — Telephone Encounter (Signed)
Left message to call Hason Ofarrell at [phone]. 

## 2015-08-12 NOTE — Telephone Encounter (Signed)
Spoke with patient. Advised of results as seen below from [place]. She is agreeable and verbalizes understanding. Patient sees Christa See, MD for primary care. Would like an afternoon appointment. Advised I will contact her PCP's office and return call with appointment date and time. She is agreeable.  Call to [place]. Scheduled patient for 08/16/2015 at 3:45 pm with [place]. Lab results faxed with cover sheet and confirmation to Dr.Street at  [phone].  Patient has been notified of appointment date and time and is agreeable.  Routing to provider for final review. Patient agreeable to disposition. Will close encounter.

## 2015-08-12 NOTE — Telephone Encounter (Signed)
-----   Message from Regina Eck, CNM sent at 08/11/2015  7:40 AM EDT ----- Notify patient WBC count is still elevated and Neutro abs are elevated and as discussed before she needs evaluation with her PCP. Please refer her back to PCP and help her make appt, for evaluation. She has been asymptomatic  She has difficulty with scheduling  this. She will need a copy of her results. She was to follow up last time and did not.

## 2015-08-16 DIAGNOSIS — D72829 Elevated white blood cell count, unspecified: Secondary | ICD-10-CM | POA: Diagnosis not present

## 2015-09-08 DIAGNOSIS — J209 Acute bronchitis, unspecified: Secondary | ICD-10-CM | POA: Diagnosis not present

## 2015-09-13 DIAGNOSIS — D72829 Elevated white blood cell count, unspecified: Secondary | ICD-10-CM | POA: Diagnosis not present

## 2015-11-08 DIAGNOSIS — M5412 Radiculopathy, cervical region: Secondary | ICD-10-CM | POA: Diagnosis not present

## 2015-11-26 DIAGNOSIS — J209 Acute bronchitis, unspecified: Secondary | ICD-10-CM | POA: Diagnosis not present

## 2016-01-06 DIAGNOSIS — J069 Acute upper respiratory infection, unspecified: Secondary | ICD-10-CM | POA: Diagnosis not present

## 2016-01-06 DIAGNOSIS — J209 Acute bronchitis, unspecified: Secondary | ICD-10-CM | POA: Diagnosis not present

## 2016-04-06 DIAGNOSIS — J209 Acute bronchitis, unspecified: Secondary | ICD-10-CM | POA: Diagnosis not present

## 2016-04-06 DIAGNOSIS — J0100 Acute maxillary sinusitis, unspecified: Secondary | ICD-10-CM | POA: Diagnosis not present

## 2016-04-06 DIAGNOSIS — R1110 Vomiting, unspecified: Secondary | ICD-10-CM | POA: Diagnosis not present

## 2016-04-14 DIAGNOSIS — M65849 Other synovitis and tenosynovitis, unspecified hand: Secondary | ICD-10-CM | POA: Diagnosis not present

## 2016-06-01 ENCOUNTER — Encounter: Payer: Self-pay | Admitting: Certified Nurse Midwife

## 2016-06-01 ENCOUNTER — Ambulatory Visit (INDEPENDENT_AMBULATORY_CARE_PROVIDER_SITE_OTHER): Payer: BLUE CROSS/BLUE SHIELD | Admitting: Certified Nurse Midwife

## 2016-06-01 VITALS — BP 104/70 | HR 68 | Resp 16 | Ht 67.25 in | Wt 237.0 lb

## 2016-06-01 DIAGNOSIS — Z3049 Encounter for surveillance of other contraceptives: Secondary | ICD-10-CM | POA: Diagnosis not present

## 2016-06-01 DIAGNOSIS — B373 Candidiasis of vulva and vagina: Secondary | ICD-10-CM

## 2016-06-01 DIAGNOSIS — Z124 Encounter for screening for malignant neoplasm of cervix: Secondary | ICD-10-CM

## 2016-06-01 DIAGNOSIS — Z01419 Encounter for gynecological examination (general) (routine) without abnormal findings: Secondary | ICD-10-CM | POA: Diagnosis not present

## 2016-06-01 DIAGNOSIS — B3731 Acute candidiasis of vulva and vagina: Secondary | ICD-10-CM

## 2016-06-01 MED ORDER — ETONOGESTREL-ETHINYL ESTRADIOL 0.12-0.015 MG/24HR VA RING: VAGINAL_RING | VAGINAL | 4 refills | Status: DC

## 2016-06-01 MED ORDER — FLUCONAZOLE 150 MG PO TABS: 150.0000 mg | ORAL_TABLET | Freq: Once | ORAL | 0 refills | Status: AC

## 2016-06-01 NOTE — Progress Notes (Signed)
36 y.o. G0P0000 Single  Caucasian Fe here for annual exam. Periods normal and lighter, no cramping except with onset, since she has been doing regular exercise. Exercising 4 times a week. Sees Dr  Venetia Maxon with [facility] for hypertension management, stable, no change in medication. Labs and aex there once yearly.Ardyth Harps working well for cycle control.Not sexually active. Complaining of increase vaginal discharge and vaginal irritation for "a while". Feet are peeling and not sure why. Has noted this for past week. No change in soaps or activity. 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 Wears steel toe protective shoes and long pants daily. Does change socks and clothes daily with bath. No other health issues today.  Patient's last menstrual period was 05/04/2016.          Sexually active: No.  The current method of family planning is NuvaRing vaginal inserts.    Exercising: Yes.    walking at the gym Smoker:  no  Health Maintenance: Pap:  05-28-14 neg HPV HR neg History of Abnormal Pap: no MMG:  none Self Breast exams: no Colonoscopy:  none BMD:   none TDaP:  2015 Shingles: no Pneumonia: no Hep C and HIV: not done Labs: none   reports that she has quit smoking. She has never used smokeless tobacco. She reports that she does not drink alcohol or use drugs.  Past Medical History:  Diagnosis Date  . Anemia   . Hypertension     History reviewed. No pertinent surgical history.  Current Outpatient Prescriptions  Medication Sig Dispense Refill  . etonogestrel-ethinyl estradiol (NUVARING) 0.12-0.015 MG/24HR vaginal ring Insert vaginally and leave in place for 3 consecutive weeks, then remove for 1 week. 3 each 4  . fluticasone (FLONASE) 50 MCG/ACT nasal spray Place into both nostrils daily.    . hydrochlorothiazide (HYDRODIURIL) 25 MG tablet     . losartan (COZAAR) 50 MG tablet      No current facility-administered medications for this visit.     Family History   Problem Relation Age of Onset  . Hypertension Mother   . Diabetes Mother   . Depression Mother   . Depression Sister   . Cancer Maternal Aunt        throat and stomach cancer  . Heart attack Maternal Uncle   . Colon cancer Father 103       in chemo now    ROS:  Pertinent items are noted in HPI.  Otherwise, a comprehensive ROS was negative.  Exam:   BP 104/70   Pulse 68   Resp 16   Ht 5' 7.25" (1.708 m)   Wt 237 lb (107.5 kg)   LMP 05/04/2016   BMI 36.84 kg/m  Height: 5' 7.25" (170.8 cm) Ht Readings from Last 3 Encounters:  06/01/16 5' 7.25" (1.708 m)  06/01/15 5' 7.25" (1.708 m)  05/26/14 5' 7.25" (1.708 m)    General appearance: alert, cooperative and appears stated age Head: Normocephalic, without obvious abnormality, atraumatic Neck: no adenopathy, supple, symmetrical, trachea midline and thyroid normal to inspection and palpation Lungs: clear to auscultation bilaterally Breasts: normal appearance, no masses or tenderness, No nipple retraction or dimpling, No nipple discharge or bleeding, No axillary or supraclavicular adenopathy Heart: regular rate and rhythm Abdomen: soft, non-tender; no masses,  no organomegaly Extremities: extremities normal, atraumatic, no cyanosis or edema Skin: Skin color, texture, turgor normal. No rashes or lesions Lymph nodes: Cervical, supraclavicular, and axillary nodes normal. No abnormal inguinal nodes palpated  Neurologic: Grossly normal Feet: bilateral peeling of skin of fit, no redness, no lesions, non tender   Pelvic: External genitalia:  no lesions, normal female with slight fine pink rash with exudate, wet prep taken. Mole noted on perineal area, on right, no  Increase in size, but is elongated and "sometimes bothers me", no bleeding, pink in color, no irregular shape.               Urethra:  normal appearing urethra with no masses, tenderness or lesions              Bartholin's and Skene's: normal                 Vagina: normal  appearing vagina with thick cottage cheese like, slight odorous discharge, no lesions. Ph 4.0 wet prep taken              Cervix: no cervical motion tenderness, no lesions and nulliparous appearance              Pap taken: Yes.   Bimanual Exam:  Uterus:  normal size, contour, position, consistency, mobility, non-tender              Adnexa: normal adnexa and no mass, fullness, tenderness               Rectovaginal: Confirms               Anus:  normal sphincter tone, no lesions  Chaperone present: yes   Wet prep : KOH, Saline + for yeast external and vaginally  A:  Well Woman with normal exam  Dysmenorrhea/menorrhagia history with anemia in past, Nuvaring working well to control.  Hypertension well controlled with medication and management with PCP  Yeast vaginitis/vulvitis  Perineal mole no change, but in area of cleaning after voiding.  Suspect athlete foot from moist enclosed shoe daily  P:   Reviewed health and wellness pertinent to exam  Discussed hypertension and risk of stroke with estrogen use and if any change in hypertension medications she needs to advise will need to be on POP. Patient voiced understanding and will advise if change.  Rx Nuvaring see order with instructions  Continue close follow up with PCP for labs and medication. Encouraged to lose weight to help maintain healthy status with portion control.  Discussed finding of yeast vaginitis and vulvitis and etiology. Encouraged to change underwear when moist . Decrease sugar in diet and continue exercise which will discourage yeast growth.  Rx Diflucan see order with instructions. Aveeno sitz bath prn comfort if needed.  Discussed possible removal here. Would need to schedule appointment with MD for evaluation. Patient declines.  Discussed peeling of feet and moisture exposure possible cause. Recommended removing shoes as soon as she is in car and slip on sandals or her flip flops to allow her feet to air. Also discussed  OTC antifungal powder in shoes overnight and then shake out. Warning signs with feet issues give and will need to see PCP if not change.  Pap smear: yes   counseled on breast self exam, STD prevention, HIV risk factors and prevention, feminine hygiene, adequate intake of calcium and vitamin D, diet and exercise return annually or prn  An After Visit Summary was printed and given to the patient.

## 2016-06-01 NOTE — Patient Instructions (Signed)
EXERCISE AND DIET:  We recommended that you start or continue a regular exercise program for good health. Regular exercise means any activity that makes your heart beat faster and makes you sweat.  We recommend exercising at least 30 minutes per day at least 3 days a week, preferably 4 or 5.  We also recommend a diet low in fat and sugar.  Inactivity, poor dietary choices and obesity can cause diabetes, heart attack, stroke, and kidney damage, among others.    ALCOHOL AND SMOKING:  Women should limit their alcohol intake to no more than 7 drinks/beers/glasses of wine (combined, not each!) per week. Moderation of alcohol intake to this level decreases your risk of breast cancer and liver damage. And of course, no recreational drugs are part of a healthy lifestyle.  And absolutely no smoking or even second hand smoke. Most people know smoking can cause heart and lung diseases, but did you know it also contributes to weakening of your bones? Aging of your skin?  Yellowing of your teeth and nails?  CALCIUM AND VITAMIN D:  Adequate intake of calcium and Vitamin D are recommended.  The recommendations for exact amounts of these supplements seem to change often, but generally speaking 600 mg of calcium (either carbonate or citrate) and 800 units of Vitamin D per day seems prudent. Certain women may benefit from higher intake of Vitamin D.  If you are among these women, your doctor will have told you during your visit.    PAP SMEARS:  Pap smears, to check for cervical cancer or precancers,  have traditionally been done yearly, although recent scientific advances have shown that most women can have pap smears less often.  However, every woman still should have a physical exam from her gynecologist every year. It will include a breast check, inspection of the vulva and vagina to check for abnormal growths or skin changes, a visual exam of the cervix, and then an exam to evaluate the size and shape of the uterus and  ovaries.  And after 38 years of age, a rectal exam is indicated to check for rectal cancers. We will also provide age appropriate advice regarding health maintenance, like when you should have certain vaccines, screening for sexually transmitted diseases, bone density testing, colonoscopy, mammograms, etc.   MAMMOGRAMS:  All women over 40 years old should have a yearly mammogram. Many facilities now offer a "3D" mammogram, which may cost around $50 extra out of pocket. If possible,  we recommend you accept the option to have the 3D mammogram performed.  It both reduces the number of women who will be called back for extra views which then turn out to be normal, and it is better than the routine mammogram at detecting truly abnormal areas.    COLONOSCOPY:  Colonoscopy to screen for colon cancer is recommended for all women at age 50.  We know, you hate the idea of the prep.  We agree, BUT, having colon cancer and not knowing it is worse!!  Colon cancer so often starts as a polyp that can be seen and removed at colonscopy, which can quite literally save your life!  And if your first colonoscopy is normal and you have no family history of colon cancer, most women don't have to have it again for 10 years.  Once every ten years, you can do something that may end up saving your life, right?  We will be happy to help you get it scheduled when you are ready.    Be sure to check your insurance coverage so you understand how much it will cost.  It may be covered as a preventative service at no cost, but you should check your particular policy.      Vaginal Yeast infection, Adult Vaginal yeast infection is a condition that causes soreness, swelling, and redness (inflammation) of the vagina. It also causes vaginal discharge. This is a common condition. Some women get this infection frequently. What are the causes? This condition is caused by a change in the normal balance of the yeast (candida) and bacteria that live  in the vagina. This change causes an overgrowth of yeast, which causes the inflammation. What increases the risk? This condition is more likely to develop in:  Women who take antibiotic medicines.  Women who have diabetes.  Women who take birth control pills.  Women who are pregnant.  Women who douche often.  Women who have a weak defense (immune) system.  Women who have been taking steroid medicines for a long time.  Women who frequently wear tight clothing. What are the signs or symptoms? Symptoms of this condition include:  White, thick vaginal discharge.  Swelling, itching, redness, and irritation of the vagina. The lips of the vagina (vulva) may be affected as well.  Pain or a burning feeling while urinating.  Pain during sex. How is this diagnosed? This condition is diagnosed with a medical history and physical exam. This will include a pelvic exam. Your health care provider will examine a sample of your vaginal discharge under a microscope. Your health care provider may send this sample for testing to confirm the diagnosis. How is this treated? This condition is treated with medicine. Medicines may be over-the-counter or prescription. You may be told to use one or more of the following:  Medicine that is taken orally.  Medicine that is applied as a cream.  Medicine that is inserted directly into the vagina (suppository). Follow these instructions at home:  Take or apply over-the-counter and prescription medicines only as told by your health care provider.  Do not have sex until your health care provider has approved. Tell your sex partner that you have a yeast infection. That person should go to his or her health care provider if he or she develops symptoms.  Do not wear tight clothes, such as pantyhose or tight pants.  Avoid using tampons until your health care provider approves.  Eat more yogurt. This may help to keep your yeast infection from  returning.  Try taking a sitz bath to help with discomfort. This is a warm water bath that is taken while you are sitting down. The water should only come up to your hips and should cover your buttocks. Do this 3-4 times per day or as told by your health care provider.  Do not douche.  Wear breathable, cotton underwear.  If you have diabetes, keep your blood sugar levels under control. Contact a health care provider if:  You have a fever.  Your symptoms go away and then return.  Your symptoms do not get better with treatment.  Your symptoms get worse.  You have new symptoms.  You develop blisters in or around your vagina.  You have blood coming from your vagina and it is not your menstrual period.  You develop pain in your abdomen. This information is not intended to replace advice given to you by your health care provider. Make sure you discuss any questions you have with your health care provider. Document  Released: 10/12/2004 Document Revised: 06/16/2015 Document Reviewed: 07/06/2014 Elsevier Interactive Patient Education  2017 [employer].

## 2016-06-03 LAB — IPS PAP TEST WITH REFLEX TO HPV

## 2016-06-06 ENCOUNTER — Telehealth: Payer: Self-pay | Admitting: *Deleted

## 2016-06-06 ENCOUNTER — Other Ambulatory Visit: Payer: Self-pay | Admitting: Certified Nurse Midwife

## 2016-06-06 DIAGNOSIS — Z30011 Encounter for initial prescription of contraceptive pills: Secondary | ICD-10-CM

## 2016-06-06 MED ORDER — NORETHINDRONE 0.35 MG PO TABS: 1.0000 | ORAL_TABLET | Freq: Every day | ORAL | 3 refills | Status: DC

## 2016-06-06 NOTE — Telephone Encounter (Signed)
Spoke with patient, advised as seen below per Melvia Heaps, CNM. Patient states she is on cycle now, will plan to start POP on Sunday. Patient is aware she will complete full package of pills, no break. Reviewed bleeding profile expectations. Patient verbalizes understanding and is agreeable.  Routing to provider for final review. Patient is agreeable to disposition. Will close encounter.

## 2016-06-06 NOTE — Telephone Encounter (Signed)
-----   Message from Regina Eck, CNM sent at 06/06/2016 12:53 PM EDT ----- Regarding: OCP use with well controlled hypertension Please notify patient I feel her continued use of combination contraception with hypertension is not a good risk situation for her. We discussed at OV risk of DVT and stroke. I want her to switch to POP which will be an everyday pill of same color. Her cycles may change slightly but should work well. Order for [facility] placed. She can complete her Nuvaring cycle and start [place] when she removes the Nuvaring. Please review bleeding profile expectations. If questions can schedule OV.

## 2016-06-07 ENCOUNTER — Other Ambulatory Visit: Payer: Self-pay

## 2016-06-07 DIAGNOSIS — Z30011 Encounter for initial prescription of contraceptive pills: Secondary | ICD-10-CM

## 2016-06-07 NOTE — Telephone Encounter (Signed)
Medication refill request: Norethindrone Last AEX:  06/01/16 DL Next AEX: 06/06/17 DL Last MMG (if hormonal medication request): n/a Refill authorized: 06/06/16 #1 Package 3R. Pharmacy requesting 90-day supply

## 2016-06-08 MED ORDER — NORETHINDRONE 0.35 MG PO TABS: 1.0000 | ORAL_TABLET | Freq: Every day | ORAL | 4 refills | Status: DC

## 2016-06-22 DIAGNOSIS — E669 Obesity, unspecified: Secondary | ICD-10-CM | POA: Diagnosis not present

## 2016-06-22 DIAGNOSIS — I10 Essential (primary) hypertension: Secondary | ICD-10-CM | POA: Diagnosis not present

## 2016-06-22 DIAGNOSIS — Z1389 Encounter for screening for other disorder: Secondary | ICD-10-CM | POA: Diagnosis not present

## 2016-06-22 DIAGNOSIS — Z6837 Body mass index (BMI) 37.0-37.9, adult: Secondary | ICD-10-CM | POA: Diagnosis not present

## 2016-10-09 ENCOUNTER — Telehealth: Payer: Self-pay | Admitting: Certified Nurse Midwife

## 2016-10-09 NOTE — Telephone Encounter (Signed)
Spoke with patient. Patient states she has been taking norethindrone 0.35 mg pill since July. Has not had a menstrual cycle since starting the pill in July. Had one episode of light spotting on 10/05/2016. Reports every month she is having increased cramping and nipple tenderness, but does not have bleeding. Advised it is not uncommon to not have a menses while taking birth control pills. Patient denies missing any pills or taking any pills late. Advised will review with Melvia Heaps CNM and return call.

## 2016-10-09 NOTE — Telephone Encounter (Signed)
Patient taking birth control pills and has not had a period since July.

## 2016-10-09 NOTE — Telephone Encounter (Signed)
This is very normal with POP and she should not worry if she does not have a period. The symptoms may occur, but no bleeding this is not a concern. There progesterone thins the lining so may not produce enough to actually have bleeding. Switched to POP due to hypertension.

## 2016-10-10 NOTE — Telephone Encounter (Signed)
Left message to call Chelsea Abbott at [phone]. 

## 2016-10-10 NOTE — Telephone Encounter (Signed)
Spoke with patient, advised as seen below per Melvia Heaps, CNM. Patient verbalizes understanding and is agreeable.  Patient is agreeable to disposition. Will close encounter.

## 2016-10-10 NOTE — Telephone Encounter (Signed)
Left message to call Kaitlyn at [phone]. 

## 2016-10-10 NOTE — Telephone Encounter (Signed)
Patient returning your call.

## 2017-04-29 ENCOUNTER — Other Ambulatory Visit: Payer: Self-pay | Admitting: Certified Nurse Midwife

## 2017-04-29 DIAGNOSIS — Z30011 Encounter for initial prescription of contraceptive pills: Secondary | ICD-10-CM

## 2017-04-30 NOTE — Telephone Encounter (Signed)
Medication refill request: OCP  Last AEX:  06-01-16  Next AEX: 06-06-17  Last MMG (if hormonal medication request): N/A Refill authorized: please advise

## 2017-05-22 DIAGNOSIS — E669 Obesity, unspecified: Secondary | ICD-10-CM | POA: Diagnosis not present

## 2017-05-22 DIAGNOSIS — Z13220 Encounter for screening for lipoid disorders: Secondary | ICD-10-CM | POA: Diagnosis not present

## 2017-05-22 DIAGNOSIS — Z0000 Encounter for general adult medical examination without abnormal findings: Secondary | ICD-10-CM | POA: Diagnosis not present

## 2017-05-22 DIAGNOSIS — Z6835 Body mass index (BMI) 35.0-35.9, adult: Secondary | ICD-10-CM | POA: Diagnosis not present

## 2017-05-22 DIAGNOSIS — Z131 Encounter for screening for diabetes mellitus: Secondary | ICD-10-CM | POA: Diagnosis not present

## 2017-06-06 ENCOUNTER — Encounter: Payer: Self-pay | Admitting: Certified Nurse Midwife

## 2017-06-06 ENCOUNTER — Ambulatory Visit (INDEPENDENT_AMBULATORY_CARE_PROVIDER_SITE_OTHER): Payer: BLUE CROSS/BLUE SHIELD | Admitting: Certified Nurse Midwife

## 2017-06-06 ENCOUNTER — Other Ambulatory Visit: Payer: Self-pay

## 2017-06-06 VITALS — BP 110/80 | HR 60 | Resp 20 | Ht 68.0 in | Wt 228.4 lb

## 2017-06-06 DIAGNOSIS — Z30011 Encounter for initial prescription of contraceptive pills: Secondary | ICD-10-CM | POA: Diagnosis not present

## 2017-06-06 DIAGNOSIS — Z01419 Encounter for gynecological examination (general) (routine) without abnormal findings: Secondary | ICD-10-CM | POA: Diagnosis not present

## 2017-06-06 DIAGNOSIS — L732 Hidradenitis suppurativa: Secondary | ICD-10-CM

## 2017-06-06 DIAGNOSIS — Z8679 Personal history of other diseases of the circulatory system: Secondary | ICD-10-CM | POA: Diagnosis not present

## 2017-06-06 DIAGNOSIS — Z3041 Encounter for surveillance of contraceptive pills: Secondary | ICD-10-CM

## 2017-06-06 MED ORDER — NORETHINDRONE 0.35 MG PO TABS: 1.0000 | ORAL_TABLET | Freq: Every day | ORAL | 4 refills | Status: DC

## 2017-06-06 NOTE — Progress Notes (Signed)
37 y.o. G0P0000 Single  Caucasian Fe here for annual exam. Periods regular now with Micronor use, had amenorrhea at onset of use.Duration now is 2-4 days light.  Taking POP as directed. Sees Dr. Yves Dill every 6 months for medication management of hypertension and her CBC and labs per patient. Working on weight loss and down 10 pounds now. Goes to gym as much as she can. Has noted some "more bumps in underwear lines again" from sweating. Baths daily, but has not tried the epsom salt tub bath to help keep under control. Working on getting teeth cleaned and eye exam. No other health issues today. Going camping this weekend!  LMP 06/06/17        Sexually active: No.  The current method of family planning is abstinence/Micronor.   Exercising: Yes.    works out at gym. Smoker:  no  Health Maintenance: Pap:  05-28-14 neg HPV HR neg, 06-01-16 neg History of Abnormal Pap: no MMG:  none Self Breast exams: no Colonoscopy:  none BMD:   none TDaP:  2015 Shingles: no Pneumonia: no Hep C and HIV: no Labs: PCP   reports that she has quit smoking. She has never used smokeless tobacco. She reports that she does not drink alcohol or use drugs.  Past Medical History:  Diagnosis Date  . Anemia   . Hypertension     No past surgical history on file.  Current Outpatient Medications  Medication Sig Dispense Refill  . CAMILA 0.35 MG tablet TAKE 1 TABLET DAILY 84 tablet 0  . fluticasone (FLONASE) 50 MCG/ACT nasal spray Place into both nostrils daily.    . hydrochlorothiazide (HYDRODIURIL) 25 MG tablet     . losartan (COZAAR) 50 MG tablet      No current facility-administered medications for this visit.     Family History  Problem Relation Age of Onset  . Hypertension Mother   . Diabetes Mother   . Depression Mother   . Cancer Maternal Aunt        throat and stomach cancer  . Colon cancer Father 77       in chemo now  . Depression Sister   . Heart attack Maternal Uncle     ROS:  Pertinent items  are noted in HPI.  Otherwise, a comprehensive ROS was negative.  Exam:   BP 110/80 (BP Location: Right Arm, Patient Position: Sitting, Cuff Size: Large)   Pulse 60   Resp 20   Ht 5\' 8"  (1.727 m)   Wt 228 lb 6.4 oz (103.6 kg)   BMI 34.73 kg/m  Height: 5\' 8"  (172.7 cm) Ht Readings from Last 3 Encounters:  06/06/17 5\' 8"  (1.727 m)  06/01/16 5' 7.25" (1.708 m)  06/01/15 5' 7.25" (1.708 m)    General appearance: alert, cooperative and appears stated age Head: Normocephalic, without obvious abnormality, atraumatic Neck: no adenopathy, supple, symmetrical, trachea midline and thyroid normal to inspection and palpation Lungs: clear to auscultation bilaterally Breasts: normal appearance, no masses or tenderness, No nipple retraction or dimpling, No nipple discharge or bleeding, No axillary or supraclavicular adenopathy, healed area of hidradenitis at bra line noted bilateral Heart: regular rate and rhythm Abdomen: soft, non-tender; no masses,  no organomegaly Extremities: extremities normal, atraumatic, no cyanosis or edema Skin: Skin color, texture, turgor normal. No rashes or lesions Lymph nodes: Cervical, supraclavicular, and axillary nodes normal. No abnormal inguinal nodes palpated Neurologic: Grossly normal   Pelvic: External genitalia:  no lesions,  Area of hidradenitis  Urethra:  normal appearing urethra with no masses, tenderness or lesions              Bartholin's and Skene's: normal                 Vagina: normal appearing vagina with normal color and discharge, no lesions, scant blood noted in vagina              Cervix: no cervical motion tenderness, no lesions and nulliparous appearance              Pap taken: No. Bimanual Exam:  Uterus:  normal size, contour, position, consistency, mobility, non-tender and anteverted              Adnexa: normal adnexa and no mass, fullness, tenderness               Rectovaginal: Confirms               Anus:  normal sphincter  tone, no lesions  Chaperone present: yes  A:  Well Woman with normal exam  Contraception for cycle control only working well  Hypertension with PCP management, stable on medication now  Hidradenitis in groin area  P:   Reviewed health and wellness pertinent to exam  Discussed risks/benefits/warning signs with use of Micronor, desires continuance.  Rx Micronor see order with instructions  Continue follow up PCP as indicated  Discussed Hidradenitis and Epsom salt tub bath twice weekly to prevent and daily when occurring. Printed handout given. Questions addressed.  Pap smear: no   counseled on breast self exam, feminine hygiene, use and side effects of OCP's, adequate intake of calcium and vitamin D, diet and exercise  return annually or prn  An After Visit Summary was printed and given to the patient.

## 2017-06-06 NOTE — Patient Instructions (Signed)
EXERCISE AND DIET:  We recommended that you start or continue a regular exercise program for good health. Regular exercise means any activity that makes your heart beat faster and makes you sweat.  We recommend exercising at least 30 minutes per day at least 3 days a week, preferably 4 or 5.  We also recommend a diet low in fat and sugar.  Inactivity, poor dietary choices and obesity can cause diabetes, heart attack, stroke, and kidney damage, among others.    ALCOHOL AND SMOKING:  Women should limit their alcohol intake to no more than 7 drinks/beers/glasses of wine (combined, not each!) per week. Moderation of alcohol intake to this level decreases your risk of breast cancer and liver damage. And of course, no recreational drugs are part of a healthy lifestyle.  And absolutely no smoking or even second hand smoke. Most people know smoking can cause heart and lung diseases, but did you know it also contributes to weakening of your bones? Aging of your skin?  Yellowing of your teeth and nails?  CALCIUM AND VITAMIN D:  Adequate intake of calcium and Vitamin D are recommended.  The recommendations for exact amounts of these supplements seem to change often, but generally speaking 600 mg of calcium (either carbonate or citrate) and 800 units of Vitamin D per day seems prudent. Certain women may benefit from higher intake of Vitamin D.  If you are among these women, your doctor will have told you during your visit.    PAP SMEARS:  Pap smears, to check for cervical cancer or precancers,  have traditionally been done yearly, although recent scientific advances have shown that most women can have pap smears less often.  However, every woman still should have a physical exam from her gynecologist every year. It will include a breast check, inspection of the vulva and vagina to check for abnormal growths or skin changes, a visual exam of the cervix, and then an exam to evaluate the size and shape of the uterus and  ovaries.  And after 40 years of age, a rectal exam is indicated to check for rectal cancers. We will also provide age appropriate advice regarding health maintenance, like when you should have certain vaccines, screening for sexually transmitted diseases, bone density testing, colonoscopy, mammograms, etc.   MAMMOGRAMS:  All women over 40 years old should have a yearly mammogram. Many facilities now offer a "3D" mammogram, which may cost around $50 extra out of pocket. If possible,  we recommend you accept the option to have the 3D mammogram performed.  It both reduces the number of women who will be called back for extra views which then turn out to be normal, and it is better than the routine mammogram at detecting truly abnormal areas.    COLONOSCOPY:  Colonoscopy to screen for colon cancer is recommended for all women at age 50.  We know, you hate the idea of the prep.  We agree, BUT, having colon cancer and not knowing it is worse!!  Colon cancer so often starts as a polyp that can be seen and removed at colonscopy, which can quite literally save your life!  And if your first colonoscopy is normal and you have no family history of colon cancer, most women don't have to have it again for 10 years.  Once every ten years, you can do something that may end up saving your life, right?  We will be happy to help you get it scheduled when you are ready.    Be sure to check your insurance coverage so you understand how much it will cost.  It may be covered as a preventative service at no cost, but you should check your particular policy.     Hidradenitis Suppurativa Hidradenitis suppurativa is a long-term (chronic) skin disease that starts with blocked sweat glands or hair follicles. Bacteria may grow in these blocked openings of your skin. Hidradenitis suppurativa is like a severe form of acne that develops in areas of your body where acne would be unusual. It is most likely to affect the areas of your body where  skin rubs against skin and becomes moist. This includes your:  Underarms.  Groin.  Genital areas.  Buttocks.  Upper thighs.  Breasts.  Hidradenitis suppurativa may start out with small pimples. The pimples can develop into deep sores that break open (rupture) and drain pus. Over time your skin may thicken and become scarred. Hidradenitis suppurativa cannot be passed from person to person. What are the causes? The exact cause of hidradenitis suppurativa is not known. This condition may be due to:  Female and female hormones. The condition is rare before and after puberty.  An overactive body defense system (immune system). Your immune system may overreact to the blocked hair follicles or sweat glands and cause swelling and pus-filled sores.  What increases the risk? You may have a higher risk of hidradenitis suppurativa if you:  Are a woman.  Are between ages 70 and 22.  Have a family history of hidradenitis suppurativa.  Have a personal history of acne.  Are overweight.  Smoke.  Take the drug lithium.  What are the signs or symptoms? The first signs of an outbreak are usually painful skin bumps that look like pimples. As the condition progresses:  Skin bumps may get bigger and grow deeper into the skin.  Bumps under the skin may rupture and drain smelly pus.  Skin may become itchy and infected.  Skin may thicken and scar.  Drainage may continue through tunnels under the skin (fistulas).  Walking and moving your arms can become painful.  How is this diagnosed? Your health care provider may diagnose hidradenitis suppurativa based on your medical history and your signs and symptoms. A physical exam will also be done. You may need to see a health care provider who specializes in skin diseases (dermatologist). You may also have tests done to confirm the diagnosis. These can include:  Swabbing a sample of pus or drainage from your skin so it can be sent to the lab and  tested for infection.  Blood tests to check for infection.  How is this treated? The same treatment will not work for everybody with hidradenitis suppurativa. Your treatment will depend on how severe your symptoms are. You may need to try several treatments to find what works best for you. Part of your treatment may include cleaning and bandaging (dressing) your wounds. You may also have to take medicines, such as the following:  Antibiotics.  Acne medicines.  Medicines to block or suppress the immune system.  A diabetes medicine (metformin) is sometimes used to treat this condition.  For women, birth control pills can sometimes help relieve symptoms.  You may need surgery if you have a severe case of hidradenitis suppurativa that does not respond to medicine. Surgery may involve:  Using a laser to clear the skin and remove hair follicles.  Opening and draining deep sores.  Removing the areas of skin that are diseased and scarred.  Follow these instructions at home:  Learn as much as you can about your disease, and work closely with your health care providers.  Take medicines only as directed by your health care provider.  If you were prescribed an antibiotic medicine, finish it all even if you start to feel better.  If you are overweight, losing weight may be very helpful. Try to reach and maintain a healthy weight.  Do not use any tobacco products, including cigarettes, chewing tobacco, or electronic cigarettes. If you need help quitting, ask your health care provider.  Do not shave the areas where you get hidradenitis suppurativa.  Do not wear deodorant.  Wear loose-fitting clothes.  Try not to overheat and get sweaty.  Take a daily bleach Abbott as directed by your health care provider. ? Fill your bathtub halfway with water. ? Pour in  cup of unscented household bleach. ? Soak for 5-10 minutes.  Cover sore areas with a warm, clean washcloth (compress) for 5-10  minutes. Contact a health care provider if:  You have a flare-up of hidradenitis suppurativa.  You have chills or a fever.  You are having trouble controlling your symptoms at home. This information is not intended to replace advice given to you by your health care provider. Make sure you discuss any questions you have with your health care provider. Document Released: 08/17/2003 Document Revised: 06/10/2015 Document Reviewed: 04/04/2013 Elsevier Interactive Patient Education  2018 Chelsea Abbott  Chelsea Abbott is OK to use. 3 tablespoons in Abbott tub and do tub Abbott twice weekly, to prevent bumps from sweating.

## 2017-07-22 ENCOUNTER — Other Ambulatory Visit: Payer: Self-pay | Admitting: Certified Nurse Midwife

## 2017-07-22 DIAGNOSIS — Z30011 Encounter for initial prescription of contraceptive pills: Secondary | ICD-10-CM

## 2017-07-23 NOTE — Telephone Encounter (Signed)
New Rx sent 06/06/17 90day supply with 4 refills

## 2018-01-22 DIAGNOSIS — B359 Dermatophytosis, unspecified: Secondary | ICD-10-CM | POA: Diagnosis not present

## 2018-03-06 DIAGNOSIS — E785 Hyperlipidemia, unspecified: Secondary | ICD-10-CM | POA: Diagnosis not present

## 2018-03-06 DIAGNOSIS — R739 Hyperglycemia, unspecified: Secondary | ICD-10-CM | POA: Diagnosis not present

## 2018-06-12 ENCOUNTER — Ambulatory Visit: Payer: BLUE CROSS/BLUE SHIELD | Admitting: Certified Nurse Midwife

## 2018-07-29 ENCOUNTER — Other Ambulatory Visit: Payer: Self-pay | Admitting: Certified Nurse Midwife

## 2018-07-29 DIAGNOSIS — Z3041 Encounter for surveillance of contraceptive pills: Secondary | ICD-10-CM

## 2018-07-29 NOTE — Progress Notes (Signed)
38 y.o. G0P0000 Single  Caucasian Fe here for annual exam. Periods normal, no missed periods. POP working well. Denies warning signs with use. Continues follow up with PCP regarding hypertension management all stable, no medication changes. Labs all normal with PCP. Aware of weight gain, plans to work on weight loss and exercise with mother.Not sexually active. No other health issues today. Had niece with Covid 54, not hospitalized, recovered.  Patient's last menstrual period was 07/13/2018 (exact date).          Sexually active: No.  The current method of family planning is oral progesterone-only contraceptive.    Exercising: No.  exercise Smoker:  no  Review of Systems  Constitutional: Negative.   HENT: Negative.   Eyes: Negative.   Respiratory: Negative.   Cardiovascular: Negative.   Gastrointestinal: Negative.   Genitourinary: Negative.   Musculoskeletal: Negative.   Skin: Negative.   Neurological: Negative.   Endo/Heme/Allergies: Negative.   Psychiatric/Behavioral: Negative.     Health Maintenance: Pap:  06-01-16 neg, 05-28-14 neg HPV HR neg History of Abnormal Pap: no MMG:  none Self Breast exams: occ Colonoscopy:  none BMD:   none TDaP:  2015 Shingles: no Pneumonia: no Hep C and HIV: both neg per patient Labs: PCP   reports that she has quit smoking. She has never used smokeless tobacco. She reports that she does not drink alcohol or use drugs.  Past Medical History:  Diagnosis Date  . Anemia   . Hypertension     History reviewed. No pertinent surgical history.  Current Outpatient Medications  Medication Sig Dispense Refill  . fluticasone (FLONASE) 50 MCG/ACT nasal spray Place into both nostrils daily.    . hydrochlorothiazide (HYDRODIURIL) 25 MG tablet     . losartan (COZAAR) 50 MG tablet     . norethindrone (CAMILA) 0.35 MG tablet Take 1 tablet (0.35 mg total) by mouth daily. 84 tablet 4  . VITAMIN D PO Take by mouth. gummy     No current  facility-administered medications for this visit.     Family History  Problem Relation Age of Onset  . Hypertension Mother   . Diabetes Mother   . Depression Mother   . Cancer Maternal Aunt        throat and stomach cancer  . Colon cancer Father 20       in chemo now  . Depression Sister   . Heart attack Maternal Uncle     ROS:  Pertinent items are noted in HPI.  Otherwise, a comprehensive ROS was negative.  Exam:   BP 110/80   Pulse 68   Temp 98.4 F (36.9 C) (Skin)   Resp 16   Ht 5' 7.25" (1.708 m)   Wt 238 lb (108 kg)   LMP 07/13/2018 (Exact Date)   BMI 37.00 kg/m  Height: 5' 7.25" (170.8 cm) Ht Readings from Last 3 Encounters:  07/30/18 5' 7.25" (1.708 m)  06/06/17 5\' 8"  (1.727 m)  06/01/16 5' 7.25" (1.708 m)    General appearance: alert, cooperative and appears stated age Head: Normocephalic, without obvious abnormality, atraumatic Neck: no adenopathy, supple, symmetrical, trachea midline and thyroid normal to inspection and palpation Lungs: clear to auscultation bilaterally Breasts: normal appearance, no masses or tenderness, No nipple retraction or dimpling, No nipple discharge or bleeding, No axillary or supraclavicular adenopathy Heart: regular rate and rhythm Abdomen: soft, non-tender; no masses,  no organomegaly Extremities: extremities normal, atraumatic, no cyanosis or edema Skin: Skin color, texture, turgor normal. No rashes  or lesions Lymph nodes: Cervical, supraclavicular, and axillary nodes normal. No abnormal inguinal nodes palpated Neurologic: Grossly normal   Pelvic: External genitalia:  no lesions              Urethra:  normal appearing urethra with no masses, tenderness or lesions              Bartholin's and Skene's: normal                 Vagina: normal appearing vagina with normal color and discharge, no lesions              Cervix: no cervical motion tenderness, no lesions and nulliparous appearance              Pap taken: Yes.    Bimanual Exam:  Uterus:  normal size, contour, position, consistency, mobility, non-tender and anteverted              Adnexa: normal adnexa and no mass, fullness, tenderness               Rectovaginal: Confirms               Anus:  normal sphincter tone, no lesions  Chaperone present: yes  A:  Well Woman with normal exam  Contraception cycle control only POP working well  Hypertension well controlled with PCP management  Weight gain, not intentional  P:   Reviewed health and wellness pertinent to exam  Risks/benefits/warning signs, bleeding expectations reviewed. Desires continuance  Rx Camilla see order with instructions  Continue follow up with PCP as indicated  Discussed importance of maintaining normal weight to avoid other health concerns. Discussed walking daily.  Pap smear: yes  counseled on breast self exam, mammography screening start at 40, STD prevention, HIV risk factors and prevention, feminine hygiene, use and side effects of OCP's, adequate intake of calcium and vitamin D, diet and exercise return annually or prn  An After Visit Summary was printed and given to the patient.

## 2018-07-30 ENCOUNTER — Ambulatory Visit (INDEPENDENT_AMBULATORY_CARE_PROVIDER_SITE_OTHER): Payer: BC Managed Care – PPO | Admitting: Certified Nurse Midwife

## 2018-07-30 ENCOUNTER — Other Ambulatory Visit: Payer: Self-pay

## 2018-07-30 ENCOUNTER — Other Ambulatory Visit (HOSPITAL_COMMUNITY)
Admission: RE | Admit: 2018-07-30 | Discharge: 2018-07-30 | Disposition: A | Discharge status: Home / Self Care | Payer: BC Managed Care – PPO | Source: Ambulatory Visit | Attending: Certified Nurse Midwife | Admitting: Certified Nurse Midwife

## 2018-07-30 ENCOUNTER — Encounter: Payer: Self-pay | Admitting: Certified Nurse Midwife

## 2018-07-30 VITALS — BP 110/80 | HR 68 | Temp 98.4°F | Resp 16 | Ht 67.25 in | Wt 238.0 lb

## 2018-07-30 DIAGNOSIS — Z124 Encounter for screening for malignant neoplasm of cervix: Secondary | ICD-10-CM

## 2018-07-30 DIAGNOSIS — Z01419 Encounter for gynecological examination (general) (routine) without abnormal findings: Secondary | ICD-10-CM | POA: Diagnosis not present

## 2018-07-30 DIAGNOSIS — Z3041 Encounter for surveillance of contraceptive pills: Secondary | ICD-10-CM

## 2018-07-30 MED ORDER — NORETHINDRONE 0.35 MG PO TABS: 1.0000 | ORAL_TABLET | Freq: Every day | ORAL | 4 refills | Status: DC

## 2018-08-01 LAB — CYTOLOGY - PAP
Diagnosis: NEGATIVE
HPV: NOT DETECTED

## 2018-08-26 DIAGNOSIS — M722 Plantar fascial fibromatosis: Secondary | ICD-10-CM | POA: Diagnosis not present

## 2018-10-28 ENCOUNTER — Other Ambulatory Visit: Payer: Self-pay | Admitting: Certified Nurse Midwife

## 2018-10-28 DIAGNOSIS — Z3041 Encounter for surveillance of contraceptive pills: Secondary | ICD-10-CM

## 2018-11-24 ENCOUNTER — Other Ambulatory Visit: Payer: Self-pay | Admitting: Certified Nurse Midwife

## 2018-11-24 DIAGNOSIS — Z3041 Encounter for surveillance of contraceptive pills: Secondary | ICD-10-CM

## 2018-12-10 ENCOUNTER — Other Ambulatory Visit: Payer: Self-pay | Admitting: Certified Nurse Midwife

## 2018-12-10 DIAGNOSIS — Z3041 Encounter for surveillance of contraceptive pills: Secondary | ICD-10-CM

## 2018-12-16 DIAGNOSIS — E785 Hyperlipidemia, unspecified: Secondary | ICD-10-CM | POA: Diagnosis not present

## 2018-12-16 DIAGNOSIS — Z0000 Encounter for general adult medical examination without abnormal findings: Secondary | ICD-10-CM | POA: Diagnosis not present

## 2018-12-23 DIAGNOSIS — Z0000 Encounter for general adult medical examination without abnormal findings: Secondary | ICD-10-CM | POA: Diagnosis not present

## 2018-12-23 DIAGNOSIS — Z23 Encounter for immunization: Secondary | ICD-10-CM | POA: Diagnosis not present

## 2018-12-23 DIAGNOSIS — Z6838 Body mass index (BMI) 38.0-38.9, adult: Secondary | ICD-10-CM | POA: Diagnosis not present

## 2019-02-18 DIAGNOSIS — Z1231 Encounter for screening mammogram for malignant neoplasm of breast: Secondary | ICD-10-CM | POA: Diagnosis not present

## 2019-02-18 DIAGNOSIS — R928 Other abnormal and inconclusive findings on diagnostic imaging of breast: Secondary | ICD-10-CM | POA: Diagnosis not present

## 2019-03-26 DIAGNOSIS — N6001 Solitary cyst of right breast: Secondary | ICD-10-CM | POA: Diagnosis not present

## 2019-03-26 DIAGNOSIS — R922 Inconclusive mammogram: Secondary | ICD-10-CM | POA: Diagnosis not present

## 2019-03-26 DIAGNOSIS — R928 Other abnormal and inconclusive findings on diagnostic imaging of breast: Secondary | ICD-10-CM | POA: Diagnosis not present

## 2019-04-04 ENCOUNTER — Encounter: Payer: Self-pay | Admitting: Certified Nurse Midwife

## 2019-08-01 ENCOUNTER — Ambulatory Visit: Payer: BC Managed Care – PPO | Admitting: Certified Nurse Midwife

## 2019-09-15 NOTE — Progress Notes (Signed)
39 y.o. G0P0000 Single White or Caucasian Not Hispanic or Latino female here for annual exam.  On POP for cycle control, makes her cycles shorter. Not sexually active.  Period Cycle (Days): 28 Period Duration (Days): 3-5 Period Pattern: Regular Menstrual Flow: Light, Moderate Menstrual Control: Tampon Menstrual Control Change Freq (Hours): 2-3 Dysmenorrhea: (!) Moderate Dysmenorrhea Symptoms: Throbbing, Headache  Patient's last menstrual period was 09/15/2019.          Sexually active: No.  The current method of family planning is oral progesterone-only contraceptive.    Exercising: No.  The patient does not participate in regular exercise at present. Smoker:  no  Health Maintenance: Pap:  7/14/20WNL HR HPV Neg,  06-01-16 neg History of abnormal Pap:  no MMG:  In [place] in 2/21, had to go back for further imaging, f/u was normal.  BMD:   None  Colonoscopy: none  TDaP:  2015 Gardasil: no, discussed will give information.    reports that she has quit smoking. She has never used smokeless tobacco. She reports that she does not drink alcohol and does not use drugs. Works in a factor.   Past Medical History:  Diagnosis Date  . Anemia   . Hypertension     History reviewed. No pertinent surgical history.  Current Outpatient Medications  Medication Sig Dispense Refill  . fluticasone (FLONASE) 50 MCG/ACT nasal spray Place into both nostrils daily.    . hydrochlorothiazide (HYDRODIURIL) 25 MG tablet     . norethindrone (CAMILA) 0.35 MG tablet Take 1 tablet (0.35 mg total) by mouth daily. 3 Package 4  . telmisartan (MICARDIS) 40 MG tablet Take 40 mg by mouth daily.    Marland Kitchen VITAMIN D PO Take by mouth. gummy     No current facility-administered medications for this visit.    Family History  Problem Relation Age of Onset  . Hypertension Mother   . Diabetes Mother   . Depression Mother   . Breast cancer Mother 39  . Cancer Maternal Aunt        throat and stomach cancer  . Colon  cancer Father 24       in chemo now  . Depression Sister   . Heart attack Maternal Uncle     Review of Systems  All other systems reviewed and are negative.   Exam:   BP 128/72   Pulse (!) 103   Ht 5\' 7"  (1.702 m)   Wt 250 lb (113.4 kg)   LMP 09/15/2019   SpO2 99%   BMI 39.16 kg/m   Weight change: @WEIGHTCHANGE @ Height:   Height: 5\' 7"  (170.2 cm)  Ht Readings from Last 3 Encounters:  09/18/19 5\' 7"  (1.702 m)  07/30/18 5' 7.25" (1.708 m)  06/06/17 5\' 8"  (1.727 m)    General appearance: alert, cooperative and appears stated age Head: Normocephalic, without obvious abnormality, atraumatic Neck: no adenopathy, supple, symmetrical, trachea midline and thyroid normal to inspection and palpation Lungs: clear to auscultation bilaterally Cardiovascular: regular rate and rhythm Breasts: normal appearance, no masses or tenderness Abdomen: soft, non-tender; non distended,  no masses,  no organomegaly Extremities: extremities normal, atraumatic, no cyanosis or edema Skin: Skin color, texture, turgor normal. No rashes or lesions Lymph nodes: Cervical, supraclavicular, and axillary nodes normal. No abnormal inguinal nodes palpated Neurologic: Grossly normal   Pelvic: External genitalia:  no lesions              Urethra:  normal appearing urethra with no masses, tenderness or lesions  Bartholins and Skenes: normal                 Vagina: normal appearing vagina with normal color and discharge, no lesions              Cervix: no lesions               Bimanual Exam:  Uterus:  normal size, contour, position, consistency, mobility, non-tender              Adnexa: no mass, fullness, tenderness               Rectovaginal: Confirms               Anus:  normal sphincter tone, no lesions  Shanon Petty chaperoned for the exam.  A:  Well Woman with normal exam  On POP for cycle control, helping  Mom diagnosed with breast cancer last year at 46  P:   No pap this year  Will  get a copy of her mammogram  Discussed breast self exam  Discussed calcium and vit D intake  Labs with primary (normal per patient)  Discussed gardasil, she would like to start. Will check on coverage  Continue OCP's

## 2019-09-16 ENCOUNTER — Encounter: Payer: Self-pay | Admitting: Obstetrics and Gynecology

## 2019-09-18 ENCOUNTER — Encounter: Payer: Self-pay | Admitting: Obstetrics and Gynecology

## 2019-09-18 ENCOUNTER — Other Ambulatory Visit: Payer: Self-pay

## 2019-09-18 ENCOUNTER — Ambulatory Visit (INDEPENDENT_AMBULATORY_CARE_PROVIDER_SITE_OTHER): Payer: BC Managed Care – PPO | Admitting: Obstetrics and Gynecology

## 2019-09-18 VITALS — BP 128/72 | HR 103 | Ht 67.0 in | Wt 250.0 lb

## 2019-09-18 DIAGNOSIS — Z23 Encounter for immunization: Secondary | ICD-10-CM | POA: Diagnosis not present

## 2019-09-18 DIAGNOSIS — Z3041 Encounter for surveillance of contraceptive pills: Secondary | ICD-10-CM

## 2019-09-18 DIAGNOSIS — Z7185 Encounter for immunization safety counseling: Secondary | ICD-10-CM

## 2019-09-18 DIAGNOSIS — Z800 Family history of malignant neoplasm of digestive organs: Secondary | ICD-10-CM | POA: Diagnosis not present

## 2019-09-18 DIAGNOSIS — Z7189 Other specified counseling: Secondary | ICD-10-CM | POA: Diagnosis not present

## 2019-09-18 DIAGNOSIS — Z01419 Encounter for gynecological examination (general) (routine) without abnormal findings: Secondary | ICD-10-CM

## 2019-09-18 MED ORDER — NORETHINDRONE 0.35 MG PO TABS: 1.0000 | ORAL_TABLET | Freq: Every day | ORAL | 4 refills | Status: DC

## 2019-09-18 NOTE — Patient Instructions (Signed)

## 2020-02-17 DIAGNOSIS — Z0000 Encounter for general adult medical examination without abnormal findings: Secondary | ICD-10-CM | POA: Diagnosis not present

## 2020-02-17 DIAGNOSIS — Z6838 Body mass index (BMI) 38.0-38.9, adult: Secondary | ICD-10-CM | POA: Diagnosis not present

## 2020-02-17 DIAGNOSIS — Z1331 Encounter for screening for depression: Secondary | ICD-10-CM | POA: Diagnosis not present

## 2020-02-24 DIAGNOSIS — Z0000 Encounter for general adult medical examination without abnormal findings: Secondary | ICD-10-CM | POA: Diagnosis not present

## 2020-02-24 DIAGNOSIS — Z13220 Encounter for screening for lipoid disorders: Secondary | ICD-10-CM | POA: Diagnosis not present

## 2020-02-24 DIAGNOSIS — Z131 Encounter for screening for diabetes mellitus: Secondary | ICD-10-CM | POA: Diagnosis not present

## 2020-04-02 DIAGNOSIS — R928 Other abnormal and inconclusive findings on diagnostic imaging of breast: Secondary | ICD-10-CM | POA: Diagnosis not present

## 2020-04-02 DIAGNOSIS — Z1231 Encounter for screening mammogram for malignant neoplasm of breast: Secondary | ICD-10-CM | POA: Diagnosis not present

## 2020-05-17 ENCOUNTER — Other Ambulatory Visit: Payer: Self-pay | Admitting: Family Medicine

## 2020-05-17 DIAGNOSIS — R928 Other abnormal and inconclusive findings on diagnostic imaging of breast: Secondary | ICD-10-CM

## 2020-05-20 ENCOUNTER — Ambulatory Visit
Admission: RE | Admit: 2020-05-20 | Discharge: 2020-05-20 | Disposition: A | Discharge status: Home / Self Care | Payer: BC Managed Care – PPO | Source: Ambulatory Visit | Attending: Family Medicine | Admitting: Family Medicine

## 2020-05-20 ENCOUNTER — Other Ambulatory Visit: Payer: Self-pay

## 2020-05-20 DIAGNOSIS — D241 Benign neoplasm of right breast: Secondary | ICD-10-CM | POA: Diagnosis not present

## 2020-05-20 DIAGNOSIS — R928 Other abnormal and inconclusive findings on diagnostic imaging of breast: Secondary | ICD-10-CM

## 2020-06-17 ENCOUNTER — Ambulatory Visit: Payer: Self-pay | Admitting: Surgery

## 2020-06-17 DIAGNOSIS — D241 Benign neoplasm of right breast: Secondary | ICD-10-CM | POA: Diagnosis not present

## 2020-06-17 NOTE — H&P (View-Only) (Signed)
Chelsea Abbott Appointment: 06/17/2020 3:40 PM Location: [facility] Patient #: [id number] DOB: 1980-09-25 Single / Language: Chelsea Abbott / Race: White Female  History of Present Illness Chelsea Moores A. [name] MD; 06/17/2020 5:18 PM) Patient words: Diagnosis Breast, right, needle core biopsy, inner posterior, - MULTIPLE INTRADUCTAL PAPILLOMAS. - SEE MICROSCOPIC DESCRIPTION.  Patient presents for mammographic abnormality noted on recent screening and subsequent diagnostic mammogram on the right. She's had no history of breast pain, mass or discharge. An area of density was noted in the upper inner quadrant of the right breast. Core biopsy showed papilloma. The area measured about 1-2 cm in maximal diameter. There is no atypia. No family history of breast cancer.  The patient is a 40 year old female.   Past Surgical History Chelsea Abbott, CMA; 06/17/2020 3:19 PM) Breast Biopsy Right.  Diagnostic Studies History Chelsea Abbott, CMA; 06/17/2020 3:19 PM) Colonoscopy never Mammogram within last year Pap Smear 1-5 years ago  Allergies Chelsea Abbott, CMA; 06/17/2020 3:19 PM) No Known Drug Allergies [06/17/2020]: Allergies Reconciled  Medication History Chelsea Abbott, CMA; 06/17/2020 3:20 PM) Norethindrone (0.35MG  Tablet, Oral) Active. Telmisartan (40MG  Tablet, Oral) Active. Medications Reconciled  Social History Chelsea Abbott, CMA; 06/17/2020 3:19 PM) Alcohol use Occasional alcohol use. Caffeine use Coffee. No drug use Tobacco use Former smoker.  Family History Chelsea Abbott, CMA; 06/17/2020 3:19 PM) Arthritis Mother. Breast Cancer Mother. Diabetes Mellitus Mother. Hypertension Mother. Prostate Cancer Father.  Pregnancy / Birth History Chelsea Abbott, CMA; 06/17/2020 3:19 PM) Age at menarche 16 years. Gravida 0 Irregular periods Para 0  Other Problems Chelsea Abbott, CMA; 06/17/2020 3:19 PM) High blood pressure     Review of Systems Chelsea Abbott CMA; 06/17/2020 3:19  PM) General Not Present- Appetite Loss, Chills, Fatigue, Fever, Night Sweats, Weight Gain and Weight Loss. Skin Not Present- Change in Wart/Mole, Dryness, Hives, Jaundice, New Lesions, Non-Healing Wounds, Rash and Ulcer. HEENT Not Present- Earache, Hearing Loss, Hoarseness, Nose Bleed, Oral Ulcers, Ringing in the Ears, Seasonal Allergies, Sinus Pain, Sore Throat, Visual Disturbances, Wears glasses/contact lenses and Yellow Eyes. Respiratory Not Present- Bloody sputum, Chronic Cough, Difficulty Breathing, Snoring and Wheezing. Breast Not Present- Breast Mass, Breast Pain, Nipple Discharge and Skin Changes. Cardiovascular Not Present- Chest Pain, Difficulty Breathing Lying Down, Leg Cramps, Palpitations, Rapid Heart Rate, Shortness of Breath and Swelling of Extremities. Gastrointestinal Not Present- Abdominal Pain, Bloating, Bloody Stool, Change in Bowel Habits, Chronic diarrhea, Constipation, Difficulty Swallowing, Excessive gas, Gets full quickly at meals, Hemorrhoids, Indigestion, Nausea, Rectal Pain and Vomiting. Female Genitourinary Not Present- Frequency, Nocturia, Painful Urination, Pelvic Pain and Urgency. Musculoskeletal Not Present- Back Pain, Joint Pain, Joint Stiffness, Muscle Pain, Muscle Weakness and Swelling of Extremities. Neurological Not Present- Decreased Memory, Fainting, Headaches, Numbness, Seizures, Tingling, Tremor, Trouble walking and Weakness. Endocrine Present- Hot flashes. Not Present- Cold Intolerance, Excessive Hunger, Hair Changes, Heat Intolerance and New Diabetes. Hematology Not Present- Blood Thinners, Easy Bruising, Excessive bleeding, Gland problems, HIV and Persistent Infections.  Vitals Chelsea Abbott CMA; 06/17/2020 3:20 PM) 06/17/2020 3:20 PM Weight: 233.38 lb Height: 67in Body Surface Area: 2.16 m Body Mass Index: 36.55 kg/m  Temp.: 34F  Pulse: 114 (Regular)  P.OX: 97% (Room air) BP: 120/78(Sitting, Left Arm, Standard)        Physical Exam  (Chelsea Carte A. Panagiotis Oelkers MD; 06/17/2020 5:20 PM)  General Mental Status-Alert. General Appearance-Consistent with stated age. Hydration-Well hydrated. Voice-Normal.  Head and Neck Head-normocephalic, atraumatic with no lesions or palpable masses. Trachea-midline. Thyroid Gland Characteristics - normal size and consistency.  Chest and Lung Exam Note: Lungs sounds are clear. Work of breathing normal.  Breast Breast - Left-Symmetric, Non Tender, No Biopsy scars, no Dimpling - Left, No Inflammation, No Lumpectomy scars, No Mastectomy scars, No Peau d' Orange. Breast - Right-Symmetric, Non Tender, No Biopsy scars, no Dimpling - Right, No Inflammation, No Lumpectomy scars, No Mastectomy scars, No Peau d' Orange. Breast Lump-No Palpable Breast Mass.  Cardiovascular Note: Normal sinus rhythm, good capillary refill  Neurologic Neurologic evaluation reveals -alert and oriented x 3 with no impairment of recent or remote memory. Mental Status-Normal.  Lymphatic Head & Neck  General Head & Neck Lymphatics: Bilateral - Description - Normal. Axillary  General Axillary Region: Bilateral - Description - Normal. Tenderness - Non Tender.    Assessment & Plan (Chelsea Mctighe A. Orville Mena MD; 06/17/2020 5:18 PM)  PAPILLOMA OF RIGHT BREAST (D24.1) Impression: Discussed pros and cons of surgery. The risks and benefits of surgery versus observation discussed. Potential malignant risk is 2-1/2% up to 15%. She has opted for right breast lumpectomy. Risk of lumpectomy include bleeding, infection, seroma, more surgery, use of seed/wire, wound care, cosmetic deformity and the need for other treatments, death , blood clots, death. Pt agrees to proceed.  Current Plans Pt Education - [facility] Free Text Education/Instructions: discussed with patient and provided information. Pt Education - [facility] General Post-op HCI Pt Education - [facility] Breast Biopsy HCI: discussed with patient and provided information.

## 2020-06-17 NOTE — H&P (Signed)
Chelsea Abbott Appointment: 06/17/2020 3:40 PM Location: [facility] Patient #: [id number] DOB: 13-Nov-1980 Single / Language: Chelsea Abbott / Race: White Female  History of Present Illness Chelsea Moores A. Shamra Bradeen MD; 06/17/2020 5:18 PM) Patient words: Diagnosis Breast, right, needle core biopsy, inner posterior, - MULTIPLE INTRADUCTAL PAPILLOMAS. - SEE MICROSCOPIC DESCRIPTION.  Patient presents for mammographic abnormality noted on recent screening and subsequent diagnostic mammogram on the right. She's had no history of breast pain, mass or discharge. An area of density was noted in the upper inner quadrant of the right breast. Core biopsy showed papilloma. The area measured about 1-2 cm in maximal diameter. There is no atypia. No family history of breast cancer.  The patient is a 40 year old female.   Past Surgical History Chelsea Abbott, CMA; 06/17/2020 3:19 PM) Breast Biopsy Right.  Diagnostic Studies History Chelsea Abbott, CMA; 06/17/2020 3:19 PM) Colonoscopy never Mammogram within last year Pap Smear 1-5 years ago  Allergies Chelsea Abbott, CMA; 06/17/2020 3:19 PM) No Known Drug Allergies [06/17/2020]: Allergies Reconciled  Medication History Chelsea Abbott, CMA; 06/17/2020 3:20 PM) Norethindrone (0.35MG  Tablet, Oral) Active. Telmisartan (40MG  Tablet, Oral) Active. Medications Reconciled  Social History Chelsea Abbott, CMA; 06/17/2020 3:19 PM) Alcohol use Occasional alcohol use. Caffeine use Coffee. No drug use Tobacco use Former smoker.  Family History Chelsea Abbott, CMA; 06/17/2020 3:19 PM) Arthritis Mother. Breast Cancer Mother. Diabetes Mellitus Mother. Hypertension Mother. Prostate Cancer Father.  Pregnancy / Birth History Chelsea Abbott, CMA; 06/17/2020 3:19 PM) Age at menarche 7 years. Gravida 0 Irregular periods Para 0  Other Problems Chelsea Abbott, CMA; 06/17/2020 3:19 PM) High blood pressure     Review of Systems Chelsea Abbott CMA; 06/17/2020 3:19  PM) General Not Present- Appetite Loss, Chills, Fatigue, Fever, Night Sweats, Weight Gain and Weight Loss. Skin Not Present- Change in Wart/Mole, Dryness, Hives, Jaundice, New Lesions, Non-Healing Wounds, Rash and Ulcer. HEENT Not Present- Earache, Hearing Loss, Hoarseness, Nose Bleed, Oral Ulcers, Ringing in the Ears, Seasonal Allergies, Sinus Pain, Sore Throat, Visual Disturbances, Wears glasses/contact lenses and Yellow Eyes. Respiratory Not Present- Bloody sputum, Chronic Cough, Difficulty Breathing, Snoring and Wheezing. Breast Not Present- Breast Mass, Breast Pain, Nipple Discharge and Skin Changes. Cardiovascular Not Present- Chest Pain, Difficulty Breathing Lying Down, Leg Cramps, Palpitations, Rapid Heart Rate, Shortness of Breath and Swelling of Extremities. Gastrointestinal Not Present- Abdominal Pain, Bloating, Bloody Stool, Change in Bowel Habits, Chronic diarrhea, Constipation, Difficulty Swallowing, Excessive gas, Gets full quickly at meals, Hemorrhoids, Indigestion, Nausea, Rectal Pain and Vomiting. Female Genitourinary Not Present- Frequency, Nocturia, Painful Urination, Pelvic Pain and Urgency. Musculoskeletal Not Present- Back Pain, Joint Pain, Joint Stiffness, Muscle Pain, Muscle Weakness and Swelling of Extremities. Neurological Not Present- Decreased Memory, Fainting, Headaches, Numbness, Seizures, Tingling, Tremor, Trouble walking and Weakness. Endocrine Present- Hot flashes. Not Present- Cold Intolerance, Excessive Hunger, Hair Changes, Heat Intolerance and New Diabetes. Hematology Not Present- Blood Thinners, Easy Bruising, Excessive bleeding, Gland problems, HIV and Persistent Infections.  Vitals Chelsea Abbott CMA; 06/17/2020 3:20 PM) 06/17/2020 3:20 PM Weight: 233.38 lb Height: 67in Body Surface Area: 2.16 m Body Mass Index: 36.55 kg/m  Temp.: 35F  Pulse: 114 (Regular)  P.OX: 97% (Room air) BP: 120/78(Sitting, Left Arm, Standard)        Physical Exam  (Chelsea Bonura A. Reco Shonk MD; 06/17/2020 5:20 PM)  General Mental Status-Alert. General Appearance-Consistent with stated age. Hydration-Well hydrated. Voice-Normal.  Head and Neck Head-normocephalic, atraumatic with no lesions or palpable masses. Trachea-midline. Thyroid Gland Characteristics - normal size and consistency.  Chest and Lung Exam Note: Lungs sounds are clear. Work of breathing normal.  Breast Breast - Left-Symmetric, Non Tender, No Biopsy scars, no Dimpling - Left, No Inflammation, No Lumpectomy scars, No Mastectomy scars, No Peau d' Orange. Breast - Right-Symmetric, Non Tender, No Biopsy scars, no Dimpling - Right, No Inflammation, No Lumpectomy scars, No Mastectomy scars, No Peau d' Orange. Breast Lump-No Palpable Breast Mass.  Cardiovascular Note: Normal sinus rhythm, good capillary refill  Neurologic Neurologic evaluation reveals -alert and oriented x 3 with no impairment of recent or remote memory. Mental Status-Normal.  Lymphatic Head & Neck  General Head & Neck Lymphatics: Bilateral - Description - Normal. Axillary  General Axillary Region: Bilateral - Description - Normal. Tenderness - Non Tender.    Assessment & Plan (Chelsea Co A. Naleyah Ohlinger MD; 06/17/2020 5:18 PM)  PAPILLOMA OF RIGHT BREAST (D24.1) Impression: Discussed pros and cons of surgery. The risks and benefits of surgery versus observation discussed. Potential malignant risk is 2-1/2% up to 15%. She has opted for right breast lumpectomy. Risk of lumpectomy include bleeding, infection, seroma, more surgery, use of seed/wire, wound care, cosmetic deformity and the need for other treatments, death , blood clots, death. Pt agrees to proceed.  Current Plans Pt Education - [facility] Free Text Education/Instructions: discussed with patient and provided information. Pt Education - [facility] General Post-op HCI Pt Education - [facility] Breast Biopsy HCI: discussed with patient and provided information.

## 2020-06-18 ENCOUNTER — Other Ambulatory Visit: Payer: Self-pay | Admitting: Surgery

## 2020-06-18 DIAGNOSIS — D241 Benign neoplasm of right breast: Secondary | ICD-10-CM

## 2020-06-29 ENCOUNTER — Other Ambulatory Visit: Payer: Self-pay

## 2020-06-29 ENCOUNTER — Encounter (HOSPITAL_COMMUNITY)
Admission: RE | Admit: 2020-06-29 | Discharge: 2020-06-29 | Disposition: A | Discharge status: Home / Self Care | Payer: BC Managed Care – PPO | Source: Ambulatory Visit | Attending: Surgery | Admitting: Surgery

## 2020-06-29 ENCOUNTER — Encounter (HOSPITAL_COMMUNITY): Payer: Self-pay

## 2020-06-29 DIAGNOSIS — Z01818 Encounter for other preprocedural examination: Secondary | ICD-10-CM | POA: Insufficient documentation

## 2020-06-29 DIAGNOSIS — D241 Benign neoplasm of right breast: Secondary | ICD-10-CM

## 2020-06-29 HISTORY — DX: Gastro-esophageal reflux disease without esophagitis: K21.9

## 2020-06-29 LAB — COMPREHENSIVE METABOLIC PANEL
ALT: 14 U/L (ref 0–44)
AST: 14 U/L — ABNORMAL LOW (ref 15–41)
Albumin: 3.7 g/dL (ref 3.5–5.0)
Alkaline Phosphatase: 65 U/L (ref 38–126)
Anion gap: 7 (ref 5–15)
BUN: 11 mg/dL (ref 6–20)
CO2: 26 mmol/L (ref 22–32)
Calcium: 9.1 mg/dL (ref 8.9–10.3)
Chloride: 102 mmol/L (ref 98–111)
Creatinine, Ser: 0.71 mg/dL (ref 0.44–1.00)
GFR, Estimated: >60 mL/min (ref >60)
Glucose, Bld: 97 mg/dL (ref 70–99)
Potassium: 3.3 mmol/L — ABNORMAL LOW (ref 3.5–5.1)
Sodium: 135 mmol/L (ref 135–145)
Total Bilirubin: 0.4 mg/dL (ref 0.3–1.2)
Total Protein: 6.8 g/dL (ref 6.5–8.1)

## 2020-06-29 LAB — CBC WITH DIFFERENTIAL/PLATELET
Abs Immature Granulocytes: 0.04 10*3/uL (ref 0.00–0.07)
Basophils Absolute: 0 10*3/uL (ref 0.0–0.1)
Basophils Relative: 0 %
Eosinophils Absolute: 0.2 10*3/uL (ref 0.0–0.5)
Eosinophils Relative: 2 %
HCT: 37.7 % (ref 36.0–46.0)
Hemoglobin: 11.8 g/dL — ABNORMAL LOW (ref 12.0–15.0)
Immature Granulocytes: 0 %
Lymphocytes Relative: 24 %
Lymphs Abs: 2.9 10*3/uL (ref 0.7–4.0)
MCH: 26.2 pg (ref 26.0–34.0)
MCHC: 31.3 g/dL (ref 30.0–36.0)
MCV: 83.6 fL (ref 80.0–100.0)
Monocytes Absolute: 0.8 10*3/uL (ref 0.1–1.0)
Monocytes Relative: 6 %
Neutro Abs: 8.4 10*3/uL — ABNORMAL HIGH (ref 1.7–7.7)
Neutrophils Relative %: 68 %
Platelets: 327 10*3/uL (ref 150–400)
RBC: 4.51 MIL/uL (ref 3.87–5.11)
RDW: 14 % (ref 11.5–15.5)
WBC: 12.4 10*3/uL — ABNORMAL HIGH (ref 4.0–10.5)
nRBC: 0 % (ref 0.0–0.2)

## 2020-06-29 NOTE — Progress Notes (Signed)
Surgical Instructions    Your procedure is scheduled on 07/07/2020.  Report to [facility] Main Entrance "A" at 10 A.M., then check in with the Admitting office.  Call this number if you have problems the morning of surgery:  [phone]   If you have any questions prior to your surgery date call [phone]: Open Monday-Friday 8am-4pm    Remember:  Do not eat after midnight the night before your surgery  You may drink clear liquids until 9am the morning of your surgery.   Clear liquids allowed are: Water, Non-Citrus Juices (without pulp), Carbonated Beverages, Clear Tea, Black Coffee Only, and Gatorade    Take these medicines the morning of surgery with :              fluticasone (FLONASE) if needed   As of today, STOP taking any Aspirin (unless otherwise instructed by your surgeon) Aleve, Naproxen, Ibuprofen, Motrin, Advil, Goody's, BC's, all herbal medications, fish oil, and all vitamins.          Do not wear jewelry or makeup Do not wear lotions, powders, perfumes/colognes, or deodorant. Do not shave 48 hours prior to surgery.  Men may shave face and neck. Do not bring valuables to the hospital. DO Not wear nail polish, gel polish, artificial nails, or any other type of covering on  natural nails including finger and toenails. If patients have artificial nails, gel coating, etc. that need to be removed by a nail salon please have this removed prior to surgery or surgery may need to be canceled/delayed if the surgeon/ anesthesia feels like the patient is unable to be adequately monitored.             [facility] is not responsible for any belongings or valuables.  Do NOT Smoke (Tobacco/Vaping) or drink Alcohol 24 hours prior to your procedure If you use a CPAP at night, you may bring all equipment for your overnight stay.   Contacts, glasses, dentures or bridgework may not be worn into surgery, please bring cases for these belongings   For patients admitted to the hospital,  discharge time will be determined by your treatment team.   Patients discharged the day of surgery will not be allowed to drive home, and someone needs to stay with them for 24 hours.  ONLY 1 SUPPORT PERSON MAY BE PRESENT WHILE YOU ARE IN SURGERY. IF YOU ARE TO BE ADMITTED ONCE YOU ARE IN YOUR ROOM YOU WILL BE ALLOWED TWO (2) VISITORS.  Minor children may have two parents present. Special consideration for safety and communication needs will be reviewed on a case by case basis.  Special instructions:    Oral Hygiene is also important to reduce your risk of infection.  Remember - BRUSH YOUR TEETH THE MORNING OF SURGERY WITH YOUR REGULAR TOOTHPASTE   [facility]- Preparing For Surgery  Before surgery, you can play an important role. Because skin is not sterile, your skin needs to be as free of germs as possible. You can reduce the number of germs on your skin by washing with CHG (chlorahexidine gluconate) Soap before surgery.  CHG is an antiseptic cleaner which kills germs and bonds with the skin to continue killing germs even after washing.     Please do not use if you have an allergy to CHG or antibacterial soaps. If your skin becomes reddened/irritated stop using the CHG.  Do not shave (including legs and underarms) for at least 48 hours prior to first CHG shower. It is  OK to shave your face.  Please follow these instructions carefully.     Shower the NIGHT BEFORE SURGERY and the MORNING OF SURGERY with CHG Soap.   If you chose to wash your hair, wash your hair first as usual with your normal shampoo. After you shampoo, rinse your hair and body thoroughly to remove the shampoo.  Then [employer] and genitals (private parts) with your normal soap and rinse thoroughly to remove soap.  After that Use CHG Soap as you would any other liquid soap. You can apply CHG directly to the skin and wash gently with a scrungie or a clean washcloth.   Apply the CHG Soap to your body ONLY FROM THE NECK DOWN.   Do not use on open wounds or open sores. Avoid contact with your eyes, ears, mouth and genitals (private parts). Wash Face and genitals (private parts)  with your normal soap.   Wash thoroughly, paying special attention to the area where your surgery will be performed.  Thoroughly rinse your body with warm water from the neck down.  DO NOT shower/wash with your normal soap after using and rinsing off the CHG Soap.  Pat yourself dry with a CLEAN TOWEL.  Wear CLEAN PAJAMAS to bed the night before surgery  Place CLEAN SHEETS on your bed the night before your surgery  DO NOT SLEEP WITH PETS.   Day of Surgery:  Take a shower with CHG soap. Wear Clean/Comfortable clothing the morning of surgery Do not apply any deodorants/lotions.   Remember to brush your teeth WITH YOUR REGULAR TOOTHPASTE.   Please read over the following fact sheets that you were given.

## 2020-06-29 NOTE — Progress Notes (Signed)
PCP - [address] Film/video editor - denies  PPM/ICD - denies Device Orders -  Rep Notified -   Chest x-ray - none EKG -06/29/20 Stress Test -  ECHO -  Cardiac Cath -   Sleep Study - denies CPAP -   Fasting Blood Sugar - n/a Checks Blood Sugar _____ times a day  Blood Thinner Instructions:n/a Aspirin Instructions:n/a  ERAS Protcol -clear liquids until 9am PRE-SURGERY Ensure or G2- none  COVID TEST- n/a; pt is posted as ambulatory surgery    Anesthesia review: yes; seed patient;abnormal EKG  Patient denies shortness of breath, fever, cough and chest pain at PAT appointment   All instructions explained to the patient, with a verbal understanding of the material. Patient agrees to go over the instructions while at home for a better understanding. Patient also instructed to self quarantine after being tested for COVID-19. The opportunity to ask questions was provided.

## 2020-07-06 ENCOUNTER — Ambulatory Visit
Admission: RE | Admit: 2020-07-06 | Discharge: 2020-07-06 | Disposition: A | Discharge status: Home / Self Care | Payer: BC Managed Care – PPO | Source: Ambulatory Visit | Attending: Surgery | Admitting: Surgery

## 2020-07-06 ENCOUNTER — Other Ambulatory Visit: Payer: Self-pay

## 2020-07-06 DIAGNOSIS — D241 Benign neoplasm of right breast: Secondary | ICD-10-CM

## 2020-07-06 DIAGNOSIS — R928 Other abnormal and inconclusive findings on diagnostic imaging of breast: Secondary | ICD-10-CM | POA: Diagnosis not present

## 2020-07-07 ENCOUNTER — Ambulatory Visit
Admission: RE | Admit: 2020-07-07 | Discharge: 2020-07-07 | Disposition: A | Discharge status: Home / Self Care | Payer: BC Managed Care – PPO | Source: Ambulatory Visit | Attending: Surgery | Admitting: Surgery

## 2020-07-07 ENCOUNTER — Ambulatory Visit (HOSPITAL_COMMUNITY): Payer: BC Managed Care – PPO | Admitting: Anesthesiology

## 2020-07-07 ENCOUNTER — Ambulatory Visit (HOSPITAL_COMMUNITY)
Admission: RE | Admit: 2020-07-07 | Discharge: 2020-07-07 | Disposition: A | Discharge status: Home / Self Care | Payer: BC Managed Care – PPO | Attending: Surgery | Admitting: Surgery

## 2020-07-07 ENCOUNTER — Encounter (HOSPITAL_COMMUNITY)
Admission: RE | Disposition: A | Discharge status: Home / Self Care | Payer: Self-pay | Source: Home / Self Care | Attending: Surgery

## 2020-07-07 ENCOUNTER — Encounter (HOSPITAL_COMMUNITY): Payer: Self-pay | Admitting: Surgery

## 2020-07-07 ENCOUNTER — Other Ambulatory Visit: Payer: Self-pay

## 2020-07-07 ENCOUNTER — Ambulatory Visit (HOSPITAL_COMMUNITY): Payer: BC Managed Care – PPO | Admitting: Physician Assistant

## 2020-07-07 DIAGNOSIS — D630 Anemia in neoplastic disease: Secondary | ICD-10-CM | POA: Diagnosis not present

## 2020-07-07 DIAGNOSIS — N6489 Other specified disorders of breast: Secondary | ICD-10-CM | POA: Diagnosis not present

## 2020-07-07 DIAGNOSIS — K219 Gastro-esophageal reflux disease without esophagitis: Secondary | ICD-10-CM | POA: Diagnosis not present

## 2020-07-07 DIAGNOSIS — Z87891 Personal history of nicotine dependence: Secondary | ICD-10-CM | POA: Insufficient documentation

## 2020-07-07 DIAGNOSIS — N649 Disorder of breast, unspecified: Secondary | ICD-10-CM | POA: Diagnosis not present

## 2020-07-07 DIAGNOSIS — D241 Benign neoplasm of right breast: Secondary | ICD-10-CM | POA: Insufficient documentation

## 2020-07-07 DIAGNOSIS — N6091 Unspecified benign mammary dysplasia of right breast: Secondary | ICD-10-CM | POA: Diagnosis not present

## 2020-07-07 DIAGNOSIS — Z79899 Other long term (current) drug therapy: Secondary | ICD-10-CM | POA: Diagnosis not present

## 2020-07-07 DIAGNOSIS — I10 Essential (primary) hypertension: Secondary | ICD-10-CM | POA: Diagnosis not present

## 2020-07-07 DIAGNOSIS — R928 Other abnormal and inconclusive findings on diagnostic imaging of breast: Secondary | ICD-10-CM | POA: Diagnosis not present

## 2020-07-07 HISTORY — PX: BREAST LUMPECTOMY WITH RADIOACTIVE SEED LOCALIZATION: SHX6424

## 2020-07-07 LAB — POCT PREGNANCY, URINE: Preg Test, Ur: NEGATIVE

## 2020-07-07 SURGERY — BREAST LUMPECTOMY WITH RADIOACTIVE SEED LOCALIZATION
Anesthesia: General | Site: Breast | Laterality: Right

## 2020-07-07 MED ORDER — CHLORHEXIDINE GLUCONATE CLOTH 2 % EX PADS: 6.0000 | MEDICATED_PAD | Freq: Once | CUTANEOUS | Status: DC

## 2020-07-07 MED ORDER — IBUPROFEN 800 MG PO TABS: 800.0000 mg | ORAL_TABLET | Freq: Three times a day (TID) | ORAL | 0 refills | Status: DC | PRN

## 2020-07-07 MED ORDER — LIDOCAINE 2% (20 MG/ML) 5 ML SYRINGE
INTRAMUSCULAR | Status: AC
  Filled 2020-07-07: qty 5

## 2020-07-07 MED ORDER — MIDAZOLAM HCL 2 MG/2ML IJ SOLN
INTRAMUSCULAR | Status: AC
  Filled 2020-07-07: qty 2

## 2020-07-07 MED ORDER — DEXAMETHASONE SODIUM PHOSPHATE 10 MG/ML IJ SOLN
INTRAMUSCULAR | Status: AC
  Filled 2020-07-07: qty 1

## 2020-07-07 MED ORDER — OXYCODONE HCL 5 MG PO TABS: 5.0000 mg | ORAL_TABLET | Freq: Once | ORAL | Status: DC | PRN

## 2020-07-07 MED ORDER — SCOPOLAMINE 1 MG/3DAYS TD PT72
1.0000 | MEDICATED_PATCH | TRANSDERMAL | Status: DC
  Administered 2020-07-07: 1.5 mg via TRANSDERMAL
  Filled 2020-07-07: qty 1

## 2020-07-07 MED ORDER — DEXTROSE 5 % IV SOLN
INTRAVENOUS | Status: DC | PRN
  Administered 2020-07-07: 3 g via INTRAVENOUS

## 2020-07-07 MED ORDER — ONDANSETRON HCL 4 MG/2ML IJ SOLN
INTRAMUSCULAR | Status: DC | PRN
  Administered 2020-07-07: 4 mg via INTRAVENOUS

## 2020-07-07 MED ORDER — FENTANYL CITRATE (PF) 100 MCG/2ML IJ SOLN
INTRAMUSCULAR | Status: DC | PRN
  Administered 2020-07-07 (×2): 25 ug via INTRAVENOUS
  Administered 2020-07-07 (×2): 50 ug via INTRAVENOUS

## 2020-07-07 MED ORDER — ORAL CARE MOUTH RINSE: 15.0000 mL | Freq: Once | OROMUCOSAL | Status: AC

## 2020-07-07 MED ORDER — LIDOCAINE 2% (20 MG/ML) 5 ML SYRINGE
INTRAMUSCULAR | Status: DC | PRN
  Administered 2020-07-07: 20 mg via INTRAVENOUS

## 2020-07-07 MED ORDER — PROPOFOL 10 MG/ML IV BOLUS
INTRAVENOUS | Status: AC
  Filled 2020-07-07: qty 20

## 2020-07-07 MED ORDER — CELECOXIB 200 MG PO CAPS
200.0000 mg | ORAL_CAPSULE | ORAL | Status: AC
  Administered 2020-07-07: 200 mg via ORAL
  Filled 2020-07-07: qty 1

## 2020-07-07 MED ORDER — VANCOMYCIN HCL 500 MG IV SOLR
INTRAVENOUS | Status: DC | PRN
  Administered 2020-07-07: 500 mg via TOPICAL

## 2020-07-07 MED ORDER — ACETAMINOPHEN 500 MG PO TABS
1000.0000 mg | ORAL_TABLET | Freq: Once | ORAL | Status: AC
  Administered 2020-07-07: 1000 mg via ORAL
  Filled 2020-07-07: qty 2

## 2020-07-07 MED ORDER — VANCOMYCIN HCL 500 MG IV SOLR
INTRAVENOUS | Status: AC
  Filled 2020-07-07: qty 500

## 2020-07-07 MED ORDER — PROMETHAZINE HCL 25 MG/ML IJ SOLN
6.2500 mg | INTRAMUSCULAR | Status: DC | PRN
  Administered 2020-07-07: 12.5 mg via INTRAVENOUS

## 2020-07-07 MED ORDER — CHLORHEXIDINE GLUCONATE 0.12 % MT SOLN
15.0000 mL | Freq: Once | OROMUCOSAL | Status: AC
  Administered 2020-07-07: 15 mL via OROMUCOSAL
  Filled 2020-07-07: qty 15

## 2020-07-07 MED ORDER — OXYCODONE HCL 5 MG/5ML PO SOLN: 5.0000 mg | Freq: Once | ORAL | Status: DC | PRN

## 2020-07-07 MED ORDER — FENTANYL CITRATE (PF) 100 MCG/2ML IJ SOLN: 25.0000 ug | INTRAMUSCULAR | Status: DC | PRN

## 2020-07-07 MED ORDER — SODIUM CHLORIDE 0.9 % IV SOLN: INTRAVENOUS | Status: DC | PRN

## 2020-07-07 MED ORDER — ONDANSETRON HCL 4 MG/2ML IJ SOLN
4.0000 mg | Freq: Once | INTRAMUSCULAR | Status: AC
  Filled 2020-07-07: qty 2

## 2020-07-07 MED ORDER — HYDROCODONE-ACETAMINOPHEN 5-325 MG PO TABS: 1.0000 | ORAL_TABLET | Freq: Four times a day (QID) | ORAL | 0 refills | Status: DC | PRN

## 2020-07-07 MED ORDER — ONDANSETRON HCL 4 MG/2ML IJ SOLN
INTRAMUSCULAR | Status: AC
  Filled 2020-07-07: qty 2

## 2020-07-07 MED ORDER — DEXAMETHASONE SODIUM PHOSPHATE 4 MG/ML IJ SOLN
INTRAMUSCULAR | Status: DC | PRN
  Administered 2020-07-07: 10 mg via INTRAVENOUS

## 2020-07-07 MED ORDER — CEFAZOLIN IN SODIUM CHLORIDE 3-0.9 GM/100ML-% IV SOLN
INTRAVENOUS | Status: AC
  Filled 2020-07-07: qty 100

## 2020-07-07 MED ORDER — ONDANSETRON HCL 4 MG/2ML IJ SOLN
INTRAMUSCULAR | Status: AC
  Administered 2020-07-07: 4 mg via INTRAVENOUS
  Filled 2020-07-07: qty 2

## 2020-07-07 MED ORDER — BUPIVACAINE-EPINEPHRINE (PF) 0.25% -1:200000 IJ SOLN
INTRAMUSCULAR | Status: AC
  Filled 2020-07-07: qty 30

## 2020-07-07 MED ORDER — MIDAZOLAM HCL 5 MG/5ML IJ SOLN
INTRAMUSCULAR | Status: DC | PRN
  Administered 2020-07-07: 2 mg via INTRAVENOUS

## 2020-07-07 MED ORDER — PROPOFOL 10 MG/ML IV BOLUS
INTRAVENOUS | Status: DC | PRN
  Administered 2020-07-07: 200 mg via INTRAVENOUS

## 2020-07-07 MED ORDER — BUPIVACAINE-EPINEPHRINE 0.25% -1:200000 IJ SOLN
INTRAMUSCULAR | Status: DC | PRN
  Administered 2020-07-07: 8 mL

## 2020-07-07 MED ORDER — PROMETHAZINE HCL 25 MG/ML IJ SOLN
INTRAMUSCULAR | Status: AC
  Filled 2020-07-07: qty 1

## 2020-07-07 MED ORDER — FENTANYL CITRATE (PF) 250 MCG/5ML IJ SOLN
INTRAMUSCULAR | Status: AC
  Filled 2020-07-07: qty 5

## 2020-07-07 MED ORDER — LACTATED RINGERS IV SOLN: INTRAVENOUS | Status: DC

## 2020-07-07 SURGICAL SUPPLY — 36 items
ADH SKN CLS APL DERMABOND .7 (GAUZE/BANDAGES/DRESSINGS) ×1
APL PRP STRL LF DISP 70% ISPRP (MISCELLANEOUS) ×1
BINDER BREAST XXLRG (GAUZE/BANDAGES/DRESSINGS) ×1 IMPLANT
CANISTER SUCT 3000ML PPV (MISCELLANEOUS) ×1 IMPLANT
CHLORAPREP W/TINT 26 (MISCELLANEOUS) ×2 IMPLANT
COVER PROBE W GEL 5X96 (DRAPES) ×2 IMPLANT
COVER SURGICAL LIGHT HANDLE (MISCELLANEOUS) ×2 IMPLANT
COVER WAND RF STERILE (DRAPES) ×2 IMPLANT
DERMABOND ADVANCED (GAUZE/BANDAGES/DRESSINGS) ×1
DERMABOND ADVANCED .7 DNX12 (GAUZE/BANDAGES/DRESSINGS) ×1 IMPLANT
DEVICE DUBIN SPECIMEN MAMMOGRA (MISCELLANEOUS) ×2 IMPLANT
DRAPE CHEST BREAST 15X10 FENES (DRAPES) ×2 IMPLANT
ELECT CAUTERY BLADE 6.4 (BLADE) ×2 IMPLANT
ELECT REM PT RETURN 9FT ADLT (ELECTROSURGICAL) ×2
ELECTRODE REM PT RTRN 9FT ADLT (ELECTROSURGICAL) ×1 IMPLANT
GLOVE SRG 8 PF TXTR STRL LF DI (GLOVE) ×1 IMPLANT
GLOVE SURG ENC MOIS LTX SZ8 (GLOVE) ×2 IMPLANT
GLOVE SURG UNDER POLY LF SZ8 (GLOVE) ×2
GOWN STRL REUS W/ TWL LRG LVL3 (GOWN DISPOSABLE) ×1 IMPLANT
GOWN STRL REUS W/ TWL XL LVL3 (GOWN DISPOSABLE) ×1 IMPLANT
GOWN STRL REUS W/TWL LRG LVL3 (GOWN DISPOSABLE) ×2
GOWN STRL REUS W/TWL XL LVL3 (GOWN DISPOSABLE) ×2
KIT BASIN OR (CUSTOM PROCEDURE TRAY) ×2 IMPLANT
KIT MARKER MARGIN INK (KITS) ×1 IMPLANT
NDL HYPO 25GX1X1/2 BEV (NEEDLE) IMPLANT
NEEDLE HYPO 25GX1X1/2 BEV (NEEDLE) ×2 IMPLANT
PACK GENERAL/GYN (CUSTOM PROCEDURE TRAY) ×2 IMPLANT
SUT MNCRL AB 4-0 PS2 18 (SUTURE) ×2 IMPLANT
SUT SILK 2 0 SH (SUTURE) IMPLANT
SUT VIC AB 2-0 SH 27 (SUTURE) ×2
SUT VIC AB 2-0 SH 27XBRD (SUTURE) IMPLANT
SUT VIC AB 3-0 SH 27 (SUTURE) ×2
SUT VIC AB 3-0 SH 27X BRD (SUTURE) IMPLANT
SUT VIC AB 3-0 SH 8-18 (SUTURE) ×2 IMPLANT
SYR CONTROL 10ML LL (SYRINGE) ×1 IMPLANT
TOWEL GREEN STERILE FF (TOWEL DISPOSABLE) ×1 IMPLANT

## 2020-07-07 NOTE — Progress Notes (Signed)
Spoke to OR pharmacy.  OK to give Ancef 3 grams.

## 2020-07-07 NOTE — Interval H&P Note (Signed)
History and Physical Interval Note:  07/07/2020 11:20 AM  Chelsea Abbott  has presented today for surgery, with the diagnosis of RIGHT BREAST PAPILLOMA.  The various methods of treatment have been discussed with the patient and family. After consideration of risks, benefits and other options for treatment, the patient has consented to  Procedure(s): RIGHT BREAST LUMPECTOMY WITH RADIOACTIVE SEED LOCALIZATION (Right) as a surgical intervention.  The patient's history has been reviewed, patient examined, no change in status, stable for surgery.  I have reviewed the patient's chart and labs.  Questions were answered to the patient's satisfaction.     Turner Daniels MD

## 2020-07-07 NOTE — Anesthesia Preprocedure Evaluation (Addendum)
Anesthesia Evaluation  Patient identified by MRN, date of birth, ID band Patient awake    Reviewed: Allergy & Precautions, NPO status , Patient's Chart, lab work & pertinent test results  Airway Mallampati: II  TM Distance: >3 FB Neck ROM: Full    Dental no notable dental hx.    Pulmonary neg pulmonary ROS, former smoker,    Pulmonary exam normal breath sounds clear to auscultation       Cardiovascular hypertension, negative cardio ROS Normal cardiovascular exam Rhythm:Regular Rate:Normal     Neuro/Psych negative neurological ROS  negative psych ROS   GI/Hepatic Neg liver ROS, GERD  ,  Endo/Other  negative endocrine ROS  Renal/GU negative Renal ROS  negative genitourinary   Musculoskeletal negative musculoskeletal ROS (+)   Abdominal   Peds negative pediatric ROS (+)  Hematology negative hematology ROS (+) anemia ,   Anesthesia Other Findings   Reproductive/Obstetrics negative OB ROS                           Anesthesia Physical Anesthesia Plan  ASA: 3  Anesthesia Plan: General   Post-op Pain Management:    Induction: Intravenous  PONV Risk Score and Plan: 3 and Treatment may vary due to age or medical condition, Midazolam, Ondansetron and Dexamethasone  Airway Management Planned: LMA  Additional Equipment:   Intra-op Plan:   Post-operative Plan: Extubation in OR  Informed Consent: I have reviewed the patients History and Physical, chart, labs and discussed the procedure including the risks, benefits and alternatives for the proposed anesthesia with the patient or authorized representative who has indicated his/her understanding and acceptance.     Dental advisory given  Plan Discussed with: CRNA  Anesthesia Plan Comments: (Patient very anxious which caused some nausea this AM after drinking water. Responded to zofran. OK for LMA. Norton Blizzard, MD  )        Anesthesia Quick Evaluation

## 2020-07-07 NOTE — Anesthesia Postprocedure Evaluation (Signed)
Anesthesia Post Note  Patient: Chelsea Abbott  Procedure(s) Performed: RIGHT BREAST LUMPECTOMY WITH RADIOACTIVE SEED LOCALIZATION (Right: Breast)     Patient location during evaluation: PACU Anesthesia Type: General Level of consciousness: awake and alert, oriented and patient cooperative Pain management: pain level controlled Vital Signs Assessment: post-procedure vital signs reviewed and stable Respiratory status: spontaneous breathing, nonlabored ventilation and respiratory function stable Cardiovascular status: blood pressure returned to baseline and stable Postop Assessment: no apparent nausea or vomiting Anesthetic complications: no   No notable events documented.  Last Vitals:  Vitals:   07/07/20 1321 07/07/20 1330  BP: 115/66 108/70  Pulse: 72 72  Resp: 19 (!) 21  Temp: 36.6 C   SpO2: 100% 100%    Last Pain:  Vitals:   07/07/20 1321  TempSrc: Oral  PainSc: [place]

## 2020-07-07 NOTE — Op Note (Signed)
/  22/2022  12:49 PM  PATIENT:  Chelsea Abbott  40 y.o. female  PRE-OPERATIVE DIAGNOSIS:  RIGHT BREAST PAPILLOMA  POST-OPERATIVE DIAGNOSIS:  RIGHT BREAST PAPILLOMA  PROCEDURE:  Procedure(s): RIGHT BREAST LUMPECTOMY WITH RADIOACTIVE SEED LOCALIZATION (Right)  SURGEON:  Surgeon(s) and Role:    * Erroll Luna, MD - Primary  PHYSICIAN ASSISTANT:   ASSISTANTS: none   ANESTHESIA:   local and general  EBL:  10 mL   BLOOD ADMINISTERED:none  DRAINS: none   LOCAL MEDICATIONS USED:  MARCAINE     SPECIMEN:  right breast  DISPOSITION OF SPECIMEN:  PATHOLOGY  COUNTS:  YES      DICTATION: .Patient was met in the holding area and questions were answered.  She presents for right breast seed lumpectomy for papilloma due to small risk of ligament C.  Options of removal or observation were discussed.  Complications of surgery and long-term expectations including cosmesis of surgery were discussed.  She opted for right breast lumpectomy.The procedure has been discussed with the patient. Alternatives to surgery have been discussed with the patient.  Risks of surgery include bleeding,  Infection,  Seroma formation, death,  and the need for further surgery.   The patient understands and wishes to proceed.     Description of procedure: The patient was met in the holding area and questions were answered.  She was taken back to the operating.  She is placed supine upon the OR table.  After induction of general anesthesia, right breast was prepped and draped in sterile fashion timeout performed.  Proper patient, site and procedure were verified.  Neoprobe was used to identify the seed.  Of note this was checked in the holding area as well.  Seed location was right medial lower inner quadrant.  Films were available for review.  Incision was made over this area.  Dissection was carried down all tissue and the seed and clip were excised with a grossly negative margin.  There were some fibrotic looking  tissue in the specimen.  The Faxitron revealed the seed and clip to be present.  Hemostasis achieved.  Vancomycin powder placed.  Local anesthetic infiltrated throughout the cavity.  Wound closed with a layer 3-0 Vicryl for Monocryl.  Dermabond applied.  All counts were found to be correct.  Patient was awoke extubated taken recovery in satisfactory condition.  PLAN OF CARE: Discharge to home after PACU  PATIENT DISPOSITION:  PACU - hemodynamically stable.   Delay start of Pharmacological VTE agent (>24hrs) due to surgical blood loss or risk of bleeding: not applicable

## 2020-07-07 NOTE — Brief Op Note (Signed)
07/07/2020  12:49 PM  PATIENT:  Chelsea Abbott  40 y.o. female  PRE-OPERATIVE DIAGNOSIS:  RIGHT BREAST PAPILLOMA  POST-OPERATIVE DIAGNOSIS:  RIGHT BREAST PAPILLOMA  PROCEDURE:  Procedure(s): RIGHT BREAST LUMPECTOMY WITH RADIOACTIVE SEED LOCALIZATION (Right)  SURGEON:  Surgeon(s) and Role:    * Erroll Luna, MD - Primary  PHYSICIAN ASSISTANT:   ASSISTANTS: none   ANESTHESIA:   local and general  EBL:  10 mL   BLOOD ADMINISTERED:none  DRAINS: none   LOCAL MEDICATIONS USED:  MARCAINE     SPECIMEN:  right breast  DISPOSITION OF SPECIMEN:  PATHOLOGY  COUNTS:  YES      DICTATION: .Patient was met in the holding area and questions were answered.  She presents for right breast seed lumpectomy for papilloma due to small risk of ligament C.  Options of removal or observation were discussed.  Complications of surgery and long-term expectations including cosmesis of surgery were discussed.  She opted for right breast lumpectomy.The procedure has been discussed with the patient. Alternatives to surgery have been discussed with the patient.  Risks of surgery include bleeding,  Infection,  Seroma formation, death,  and the need for further surgery.   The patient understands and wishes to proceed.     Description of procedure: The patient was met in the holding area and questions were answered.  She was taken back to the operating.  She is placed supine upon the OR table.  After induction of general anesthesia, right breast was prepped and draped in sterile fashion timeout performed.  Proper patient, site and procedure were verified.  Neoprobe was used to identify the seed.  Of note this was checked in the holding area as well.  Seed location was right medial lower inner quadrant.  Films were available for review.  Incision was made over this area.  Dissection was carried down all tissue and the seed and clip were excised with a grossly negative margin.  There were some fibrotic looking  tissue in the specimen.  The Faxitron revealed the seed and clip to be present.  Hemostasis achieved.  Vancomycin powder placed.  Local anesthetic infiltrated throughout the cavity.  Wound closed with a layer 3-0 Vicryl for Monocryl.  Dermabond applied.  All counts were found to be correct.  Patient was awoke extubated taken recovery in satisfactory condition.  PLAN OF CARE: Discharge to home after PACU  PATIENT DISPOSITION:  PACU - hemodynamically stable.   Delay start of Pharmacological VTE agent (>24hrs) due to surgical blood loss or risk of bleeding: not applicable

## 2020-07-07 NOTE — Discharge Instructions (Addendum)
May take Tylenol and/or NSAIDs (Ibuprofen/Aleve etc.)after 4:40pm today.   [facility] Office Phone Number [phone]  BREAST BIOPSY/ PARTIAL MASTECTOMY: POST OP INSTRUCTIONS  Always review your discharge instruction sheet given to you by the facility where your surgery was performed.  IF YOU HAVE DISABILITY OR FAMILY LEAVE FORMS, YOU MUST BRING THEM TO THE OFFICE FOR PROCESSING.  DO NOT GIVE THEM TO YOUR DOCTOR.  A prescription for pain medication may be given to you upon discharge.  Take your pain medication as prescribed, if needed.  If narcotic pain medicine is not needed, then you may take acetaminophen (Tylenol) or ibuprofen (Advil) as needed. Take your usually prescribed medications unless otherwise directed If you need a refill on your pain medication, please contact your pharmacy.  They will contact our office to request authorization.  Prescriptions will not be filled after 5pm or on week-ends. You should eat very light the first 24 hours after surgery, such as soup, crackers, pudding, etc.  Resume your normal diet the day after surgery. Most patients will experience some swelling and bruising in the breast.  Ice packs and a good support bra will help.  Swelling and bruising can take several days to resolve.  It is common to experience some constipation if taking pain medication after surgery.  Increasing fluid intake and taking a stool softener will usually help or prevent this problem from occurring.  A mild laxative (Milk of Magnesia or Miralax) should be taken according to package directions if there are no bowel movements after 48 hours. Unless discharge instructions indicate otherwise, you may remove your bandages 24-48 hours after surgery, and you may shower at that time.  You may have steri-strips (small skin tapes) in place directly over the incision.  These strips should be left on the skin for 7-10 days.  If your surgeon used skin glue on the incision, you may  shower in 24 hours.  The glue will flake off over the next 2-3 weeks.  Any sutures or staples will be removed at the office during your follow-up visit. ACTIVITIES:  You may resume regular daily activities (gradually increasing) beginning the next day.  Wearing a good support bra or sports bra minimizes pain and swelling.  You may have sexual intercourse when it is comfortable. You may drive when you no longer are taking prescription pain medication, you can comfortably wear a seatbelt, and you can safely maneuver your car and apply brakes. RETURN TO WORK:  ______________________________________________________________________________________ Dennis Bast should see your doctor in the office for a follow-up appointment approximately two weeks after your surgery.  Your doctor's nurse will typically make your follow-up appointment when she calls you with your pathology report.  Expect your pathology report 2-3 business days after your surgery.  You may call to check if you do not hear from [place] after three days. OTHER INSTRUCTIONS: _______________________________________________________________________________________________ _____________________________________________________________________________________________________________________________________ _____________________________________________________________________________________________________________________________________ _____________________________________________________________________________________________________________________________________  WHEN TO CALL YOUR DOCTOR: Fever over 101.0 Nausea and/or vomiting. Extreme swelling or bruising. Continued bleeding from incision. Increased pain, redness, or drainage from the incision.  The clinic staff is available to answer your questions during regular business hours.  Please don't hesitate to call and ask to speak to one of the nurses for clinical concerns.  If you have a medical emergency,  go to the nearest emergency room or call 911.  A surgeon from [facility] is always on call at the hospital   For further questions, please visit [email or web address]      Post  Anesthesia Home Care Instructions  Activity: Get plenty of rest for the remainder of the day. A responsible individual must stay with you for 24 hours following the procedure.  For the next 24 hours, DO NOT: -Drive a car -Paediatric nurse -Drink alcoholic beverages -Take any medication unless instructed by your physician -Make any legal decisions or sign important papers.  Meals: Start with liquid foods such as gelatin or soup. Progress to regular foods as tolerated. Avoid greasy, spicy, heavy foods. If nausea and/or vomiting occur, drink only clear liquids until the nausea and/or vomiting subsides. Call your physician if vomiting continues.  Special Instructions/Symptoms: Your throat may feel dry or sore from the anesthesia or the breathing tube placed in your throat during surgery. If this causes discomfort, gargle with warm salt water. The discomfort should disappear within 24 hours.  If you had a scopolamine patch placed behind your ear for the management of post- operative nausea and/or vomiting:  1. The medication in the patch is effective for 72 hours, after which it should be removed.  Wrap patch in a tissue and discard in the trash. Wash hands thoroughly with soap and water. 2. You may remove the patch earlier than 72 hours if you experience unpleasant side effects which may include dry mouth, dizziness or visual disturbances. 3. Avoid touching the patch. Wash your hands with soap and water after contact with the patch.

## 2020-07-07 NOTE — Anesthesia Procedure Notes (Signed)
Procedure Name: LMA Insertion Date/Time: 07/07/2020 11:55 AM Performed by: Ezequiel Kayser, CRNA Pre-anesthesia Checklist: Patient identified, Emergency Drugs available, Suction available and Patient being monitored Patient Re-evaluated:Patient Re-evaluated prior to induction Oxygen Delivery Method: Circle System Utilized Preoxygenation: Pre-oxygenation with 100% oxygen Induction Type: IV induction Ventilation: Mask ventilation without difficulty LMA: LMA inserted LMA Size: 4.0 Number of attempts: 1 Airway Equipment and Method: Bite block Placement Confirmation: positive ETCO2 Tube secured with: Tape Dental Injury: Teeth and Oropharynx as per pre-operative assessment

## 2020-07-07 NOTE — Transfer of Care (Signed)
Immediate Anesthesia Transfer of Care Note  Patient: Chelsea Abbott  Procedure(s) Performed: RIGHT BREAST LUMPECTOMY WITH RADIOACTIVE SEED LOCALIZATION (Right: Breast)  Patient Location: PACU  Anesthesia Type:General  Level of Consciousness: drowsy  Airway & Oxygen Therapy: Patient Spontanous Breathing and Patient connected to face mask oxygen  Post-op Assessment: Report given to RN and Post -op Vital signs reviewed and stable  Post vital signs: Reviewed and stable  Last Vitals:  Vitals Value Taken Time  BP    Temp    Pulse 87 07/07/20 1246  Resp 20 07/07/20 1246  SpO2 100 % 07/07/20 1246  Vitals shown include unvalidated device data.  Last Pain:  Vitals:   07/07/20 1018  TempSrc:   PainSc: 0-No pain         Complications: No notable events documented.

## 2020-07-07 NOTE — Progress Notes (Signed)
Dr. Elgie Congo aware that patient drank 16 oz of water at 0900 today.

## 2020-07-08 ENCOUNTER — Encounter (HOSPITAL_COMMUNITY): Payer: Self-pay | Admitting: Surgery

## 2020-07-09 ENCOUNTER — Encounter (HOSPITAL_COMMUNITY): Payer: Self-pay | Admitting: Surgery

## 2020-07-09 LAB — SURGICAL PATHOLOGY

## 2020-07-09 MED ORDER — GENTAMICIN SULFATE 40 MG/ML IJ SOLN
INTRAMUSCULAR | Status: DC | PRN
  Administered 2020-07-07: 500 mL

## 2020-09-23 ENCOUNTER — Other Ambulatory Visit: Payer: Self-pay

## 2020-09-23 ENCOUNTER — Encounter: Payer: Self-pay | Admitting: Obstetrics and Gynecology

## 2020-09-23 ENCOUNTER — Ambulatory Visit (INDEPENDENT_AMBULATORY_CARE_PROVIDER_SITE_OTHER): Payer: BC Managed Care – PPO | Admitting: Obstetrics and Gynecology

## 2020-09-23 VITALS — BP 110/80 | HR 88 | Ht 68.0 in | Wt 227.0 lb

## 2020-09-23 DIAGNOSIS — Z01419 Encounter for gynecological examination (general) (routine) without abnormal findings: Secondary | ICD-10-CM | POA: Diagnosis not present

## 2020-09-23 DIAGNOSIS — N763 Subacute and chronic vulvitis: Secondary | ICD-10-CM

## 2020-09-23 DIAGNOSIS — Z800 Family history of malignant neoplasm of digestive organs: Secondary | ICD-10-CM | POA: Diagnosis not present

## 2020-09-23 DIAGNOSIS — Z23 Encounter for immunization: Secondary | ICD-10-CM | POA: Diagnosis not present

## 2020-09-23 LAB — WET PREP FOR TRICH, YEAST, CLUE

## 2020-09-23 MED ORDER — BETAMETHASONE VALERATE 0.1 % EX OINT: TOPICAL_OINTMENT | CUTANEOUS | 0 refills | Status: DC

## 2020-09-23 NOTE — Progress Notes (Signed)
40 y.o. G0P0000 Single White or Caucasian Not Hispanic or Latino female here for annual exam.  She went off of POP in 1/22 secondary to weight gain. She has lost over 20 lbs since then. Cycles q 23 days x 5 days. She changes a regular tampon every 2-3 hours. Mild cramps.  Not currently sexually active, no STD concerns.   She c/o intermittent vulvar itching for a couple of months. No abnormal vaginal discharge.     She had a right breast papilloma removed in 6/22 A. 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  - Biopsy site.  - No malignancy identified.   No bowel or bladder c/o.   Patient's last menstrual period was 08/30/2020 (approximate).          Sexually active: No.  The current method of family planning is abstinence.    Exercising: No.  The patient does not participate in regular exercise at present. Smoker:  no  Health Maintenance: Pap:  7/14/20WNL HR HPV Neg,  06-01-16 neg History of abnormal Pap:  no MMG:  see epic , breast biopsy in 5/22, lumpectomy in 6/22.  BMD:   none Colonoscopy: none TDaP:  05/20/13 Gardasil: 09/18/19   reports that she has quit smoking. She has never used smokeless tobacco. She reports that she does not drink alcohol and does not use drugs.  Works in a factor.   Past Medical History:  Diagnosis Date   Anemia    GERD (gastroesophageal reflux disease)    Hypertension     Past Surgical History:  Procedure Laterality Date   BREAST LUMPECTOMY WITH RADIOACTIVE SEED LOCALIZATION Right 07/07/2020   Procedure: RIGHT BREAST LUMPECTOMY WITH RADIOACTIVE SEED LOCALIZATION;  Surgeon: Erroll Luna, MD;  Location: [facility];  Service: General;  Laterality: Right;   NO PAST SURGERIES      Current Outpatient Medications  Medication Sig Dispense Refill   Cholecalciferol (VITAMIN D3) 125 MCG (5000 UT) TABS Take 5,000 Units by mouth daily.     fluticasone (FLONASE) 50 MCG/ACT nasal spray  Place 1 spray into both nostrils daily as needed for allergies.     hydrochlorothiazide (HYDRODIURIL) 25 MG tablet Take 25 mg by mouth daily.     Multiple Vitamins-Minerals (HAIR SKIN NAILS PO) Take 2 capsules by mouth daily. Gummies     telmisartan (MICARDIS) 40 MG tablet Take 40 mg by mouth daily.     No current facility-administered medications for this visit.    Family History  Problem Relation Age of Onset   Hypertension Mother    Diabetes Mother    Depression Mother    Breast cancer Mother 23   Cancer Maternal Aunt        throat and stomach cancer   Colon cancer Father 28       in chemo now   Depression Sister    Heart attack Maternal Uncle     Review of Systems  All other systems reviewed and are negative.  Exam:   BP 110/80   Pulse 88   Ht '5\' 8"'$  (1.727 m)   Wt 227 lb (103 kg)   LMP 08/30/2020 (Approximate)   SpO2 99%   BMI 34.52 kg/m   Weight change: '@WEIGHTCHANGE'$ @ Height:   Height: '5\' 8"'$  (172.7 cm)  Ht Readings from Last 3 Encounters:  09/23/20 '5\' 8"'$  (1.727 m)  07/07/20 '5\' 8"'$  (1.727 m)  06/29/20 '5\' 9"'$  (1.753 m)    General appearance: alert, cooperative  and appears stated age Head: Normocephalic, without obvious abnormality, atraumatic Neck: no adenopathy, supple, symmetrical, trachea midline and thyroid normal to inspection and palpation Lungs: clear to auscultation bilaterally Cardiovascular: regular rate and rhythm Breasts: normal appearance, no masses or tenderness Abdomen: soft, non-tender; non distended,  no masses,  no organomegaly Extremities: extremities normal, atraumatic, no cyanosis or edema Skin: Skin color, texture, turgor normal. No rashes or lesions Lymph nodes: Cervical, supraclavicular, and axillary nodes normal. No abnormal inguinal nodes palpated Neurologic: Grossly normal   Pelvic: External genitalia:  no lesions, mild erythema and mild whitening              Urethra:  normal appearing urethra with no masses, tenderness or lesions               Bartholins and Skenes: normal                 Vagina: normal appearing vagina with a slight increase in thick, white vaginal discharge              Cervix: no lesions               Bimanual Exam:  Uterus:   no masses or tenderness              Adnexa: no mass, fullness, tenderness               Rectovaginal: Confirms               Anus:  normal sphincter tone, no lesions  Gae Dry chaperoned for the exam.  1. Well woman exam Discussed breast self exam Discussed calcium and vit D intake No pap this year Mammogram UTD Labs with primary  2. Immunization due - HPV 9-valent vaccine,Recombinat, #2  3. Family history of colon cancer Due for colonoscopy at 16  4. Subacute vulvitis - WET PREP FOR TRICH, YEAST, CLUE: negative -Will send sureswab for yeast - betamethasone valerate ointment (VALISONE) 0.1 %; Apply a pea sized amount topically BID for up to 2 weeks as needed.  Dispense: 30 g; Refill: 0

## 2020-09-23 NOTE — Patient Instructions (Signed)

## 2020-09-27 LAB — C ALBICANS, DNA: C. albicans, DNA: NOT DETECTED

## 2020-11-30 ENCOUNTER — Other Ambulatory Visit: Payer: Self-pay | Admitting: Obstetrics and Gynecology

## 2020-11-30 DIAGNOSIS — N763 Subacute and chronic vulvitis: Secondary | ICD-10-CM

## 2020-11-30 NOTE — Telephone Encounter (Signed)
Last annual exam was on 09/23/20

## 2020-12-01 NOTE — Telephone Encounter (Signed)
Please let her know that I will call in one refill on the steroid ointment. If she continues to have symptoms she should be seen again.

## 2020-12-01 NOTE — Telephone Encounter (Signed)
Left detailed message on patient voicemail. 

## 2021-01-24 ENCOUNTER — Other Ambulatory Visit: Payer: Self-pay

## 2021-01-24 ENCOUNTER — Ambulatory Visit (INDEPENDENT_AMBULATORY_CARE_PROVIDER_SITE_OTHER): Payer: BC Managed Care – PPO | Admitting: Gynecology

## 2021-01-24 DIAGNOSIS — Z23 Encounter for immunization: Secondary | ICD-10-CM | POA: Diagnosis not present

## 2021-02-15 DIAGNOSIS — I10 Essential (primary) hypertension: Secondary | ICD-10-CM | POA: Diagnosis not present

## 2021-02-15 DIAGNOSIS — Z0000 Encounter for general adult medical examination without abnormal findings: Secondary | ICD-10-CM | POA: Diagnosis not present

## 2021-02-18 DIAGNOSIS — Z23 Encounter for immunization: Secondary | ICD-10-CM | POA: Diagnosis not present

## 2021-03-01 ENCOUNTER — Other Ambulatory Visit: Payer: Self-pay | Admitting: Obstetrics and Gynecology

## 2021-03-01 DIAGNOSIS — N763 Subacute and chronic vulvitis: Secondary | ICD-10-CM

## 2021-03-02 ENCOUNTER — Ambulatory Visit (INDEPENDENT_AMBULATORY_CARE_PROVIDER_SITE_OTHER): Payer: BC Managed Care – PPO | Admitting: Obstetrics and Gynecology

## 2021-03-02 ENCOUNTER — Encounter: Payer: Self-pay | Admitting: Obstetrics and Gynecology

## 2021-03-02 ENCOUNTER — Other Ambulatory Visit: Payer: Self-pay

## 2021-03-02 VITALS — BP 122/74 | HR 72 | Ht 68.0 in | Wt 226.0 lb

## 2021-03-02 DIAGNOSIS — L732 Hidradenitis suppurativa: Secondary | ICD-10-CM

## 2021-03-02 DIAGNOSIS — N760 Acute vaginitis: Secondary | ICD-10-CM | POA: Diagnosis not present

## 2021-03-02 LAB — WET PREP FOR TRICH, YEAST, CLUE

## 2021-03-02 MED ORDER — DOXYCYCLINE MONOHYDRATE 100 MG PO TABS: 100.0000 mg | ORAL_TABLET | Freq: Every day | ORAL | 0 refills | Status: DC

## 2021-03-02 MED ORDER — BETAMETHASONE VALERATE 0.1 % EX OINT: TOPICAL_OINTMENT | CUTANEOUS | 0 refills | Status: DC

## 2021-03-02 NOTE — Patient Instructions (Signed)
Hidradenitis Suppurativa Hidradenitis suppurativa is a long-term (chronic) skin disease. It is similar to a severe form of acne, but it affects areas of the body where acne would be unusual, especially areas of the body where skin rubs against skin and becomes moist. These include: Underarms. Groin. Genital area. Buttocks. Upper thighs. Breasts. Hidradenitis suppurativa may start out as small lumps or pimples caused by blocked sweat glands or hair follicles. Pimples may develop into deep sores that break open (rupture) and drain pus. Over time, affected areas of skin may thicken and become scarred. This condition is rare and does not spread from person to person (non-contagious). What are the causes? The exact cause of this condition is not known. It may be related to: Female and female hormones. An overactive disease-fighting system (immune system). The immune system may over-react to blocked hair follicles or sweat glands and cause swelling and pus-filled sores. What increases the risk? You are more likely to develop this condition if you: Are female. Are 56-51 years old. Have a family history of hidradenitis suppurativa. Have a personal history of acne. Are overweight. Smoke. Take the medicine lithium. What are the signs or symptoms? The first symptoms are usually painful bumps in the skin, similar to pimples. The condition may get worse over time (progress), or it may only cause mild symptoms. If the disease progresses, symptoms may include: Skin bumps getting bigger and growing deeper into the skin. Bumps rupturing and draining pus. Itchy, infected skin. Skin getting thicker and scarred. Tunnels under the skin (fistulas) where pus drains from a bump. Pain during daily activities, such as pain during walking if your groin area is affected. Emotional problems, such as stress or depression. This condition may affect your appearance and your ability or willingness to wear certain clothes  or do certain activities. How is this diagnosed? This condition is diagnosed by a health care provider who specializes in skin diseases (dermatologist). You may be diagnosed based on: Your symptoms and medical history. A physical exam. Testing a pus sample for infection. Blood tests. How is this treated? Your treatment will depend on how severe your symptoms are. The same treatment will not work for everybody with this condition. You may need to try several treatments to find what works best for you. Treatment may include: Cleaning and bandaging (dressing) your wounds as needed. Lifestyle changes, such as new skin care routines. Taking medicines, such as: Antibiotics. Acne medicines. Medicines to reduce the activity of the immune system. A diabetes medicine (metformin). Birth control pills, for women. Steroids to reduce swelling and pain. Working with a mental health care provider, if you experience emotional distress due to this condition. If you have severe symptoms that do not get better with medicine, you may need surgery. Surgery may involve: Using a laser to clear the skin and remove hair follicles. Opening and draining deep sores. Removing the areas of skin that are diseased and scarred. Follow these instructions at home: Medicines  Take over-the-counter and prescription medicines only as told by your health care provider. If you were prescribed an antibiotic medicine, take it as told by your health care provider. Do not stop taking the antibiotic even if your condition improves. Skin care If you have open wounds, cover them with a clean dressing as told by your health care provider. Keep wounds clean by washing them gently with soap and water when you bathe. Do not shave the areas where you get hidradenitis suppurativa. Do not wear deodorant. Wear loose-fitting  clothes. Try to avoid getting overheated or sweaty. If you get sweaty or wet, change into clean, dry clothes as soon  as you can. To help relieve pain and itchiness, cover sore areas with a warm, clean washcloth (warm compress) for 5-10 minutes as often as needed. If told by your health care provider, take a bleach bath twice a week: Fill your bathtub halfway with water. Pour in  cup of unscented household bleach. Soak in the tub for 5-10 minutes. Only soak from the neck down. Avoid water on your face and hair. Shower to rinse off the bleach from your skin. General instructions Learn as much as you can about your disease so that you have an active role in your treatment. Work closely with your health care provider to find treatments that work for you. If you are overweight, work with your health care provider to lose weight as recommended. Do not use any products that contain nicotine or tobacco, such as cigarettes and e-cigarettes. If you need help quitting, ask your health care provider. If you struggle with living with this condition, talk with your health care provider or work with a mental health care provider as recommended. Keep all follow-up visits as told by your health care provider. This is important. Where to find more information Hidradenitis [place]bee.: https://www.hs-foundation.org/ American Academy of Dermatology: http://www.nguyen-hutchinson.com/ Contact a health care provider if you have: A flare-up of hidradenitis suppurativa. A fever or chills. Trouble controlling your symptoms at home. Trouble doing your daily activities because of your symptoms. Trouble dealing with emotional problems related to your condition. Summary Hidradenitis suppurativa is a long-term (chronic) skin disease. It is similar to a severe form of acne, but it affects areas of the body where acne would be unusual. The first symptoms are usually painful bumps in the skin, similar to pimples. The condition may only cause mild symptoms, or it may get worse over time (progress). If you have open wounds, cover them  with a clean dressing as told by your health care provider. Keep wounds clean by washing them gently with soap and water when you bathe. Besides skin care, treatment may include medicines, laser treatment, and surgery. This information is not intended to replace advice given to you by your health care provider. Make sure you discuss any questions you have with your health care provider. Document Revised: 10/28/2019 Document Reviewed: 10/28/2019 Elsevier Patient Education  2022 [employer].

## 2021-03-02 NOTE — Progress Notes (Signed)
GYNECOLOGY  VISIT   HPI: 41 y.o.   Single White or Caucasian Not Hispanic or Latino  female   G0P0000 with Patient's last menstrual period was 02/07/2021 (approximate).   here for vaginal itching. Symptoms started a couple of days ago, can be severe, worse at night. No discharge, no odor.   On questioning she reports a several month h/o of recurrent boils in the genital area, mons and under her breasts.   GYNECOLOGIC HISTORY: Patient's last menstrual period was 02/07/2021 (approximate). Contraception:none  Menopausal hormone therapy: none         OB History     Gravida  0   Para  0   Term  0   Preterm  0   AB  0   Living  0      SAB  0   IAB  0   Ectopic  0   Multiple  0   Live Births                 There are no problems to display for this patient.   Past Medical History:  Diagnosis Date   Anemia    GERD (gastroesophageal reflux disease)    Hypertension     Past Surgical History:  Procedure Laterality Date   BREAST LUMPECTOMY WITH RADIOACTIVE SEED LOCALIZATION Right 07/07/2020   Procedure: RIGHT BREAST LUMPECTOMY WITH RADIOACTIVE SEED LOCALIZATION;  Surgeon: Erroll Luna, MD;  Location: [facility];  Service: General;  Laterality: Right;   NO PAST SURGERIES      Current Outpatient Medications  Medication Sig Dispense Refill   betamethasone valerate ointment (VALISONE) 0.1 % APPLY A PEA SIZED AMOUNT TOPICALLY TWICE DAILY FOR UP TO 2 WEEKS AS NEEDED. 30 g 0   Cholecalciferol (VITAMIN D3) 125 MCG (5000 UT) TABS Take 5,000 Units by mouth daily.     fluticasone (FLONASE) 50 MCG/ACT nasal spray Place 1 spray into both nostrils daily as needed for allergies.     hydrochlorothiazide (HYDRODIURIL) 25 MG tablet Take 25 mg by mouth daily.     Multiple Vitamins-Minerals (HAIR SKIN NAILS PO) Take 2 capsules by mouth daily. Gummies     telmisartan (MICARDIS) 40 MG tablet Take 40 mg by mouth daily.     No current facility-administered medications for this visit.      ALLERGIES: Patient has no known allergies.  Family History  Problem Relation Age of Onset   Hypertension Mother    Diabetes Mother    Depression Mother    Breast cancer Mother 13   Cancer Maternal Aunt        throat and stomach cancer   Colon cancer Father 61       in chemo now   Depression Sister    Heart attack Maternal Uncle     Social History   Socioeconomic History   Marital status: Single    Spouse name: Not on file   Number of children: Not on file   Years of education: Not on file   Highest education level: Not on file  Occupational History   Not on file  Tobacco Use   Smoking status: Former   Smokeless tobacco: Never  Vaping Use   Vaping Use: Never used  Substance and Sexual Activity   Alcohol use: No   Drug use: No   Sexual activity: Not Currently    Partners: Male    Birth control/protection: Pill    Comment: nuvaring  Other Topics Concern   Not on file  Social History Narrative   Not on file   Social Determinants of Health   Financial Resource Strain: Not on file  Food Insecurity: Not on file  Transportation Needs: Not on file  Physical Activity: Not on file  Stress: Not on file  Social Connections: Not on file  Intimate Partner Violence: Not on file    Review of Systems  All other systems reviewed and are negative.  PHYSICAL EXAMINATION:    BP 122/74    Pulse 72    Ht 5\' 8"  (1.727 m)    Wt 226 lb (102.5 kg)    LMP 02/07/2021 (Approximate)    SpO2 99%    BMI 34.36 kg/m     General appearance: alert, cooperative and appears stated age Skin: scarring under her breasts from hidradenitis suppurativa  Pelvic: External genitalia:  multiple boils in different states of healing as well as scarring on the vulva, mons and just above the mons.               Urethra:  normal appearing urethra with no masses, tenderness or lesions              Bartholins and Skenes: normal                 Vagina: normal appearing vagina with a slight increase  in thick, white vaginal discharge.               Cervix: no lesions               Chaperone was present for exam.  1. Acute vaginitis - WET PREP FOR TRICH, YEAST, CLUE: negative - SureSwab Advanced Vaginitis, TMA - betamethasone valerate ointment (VALISONE) 0.1 %; APPLY A PEA SIZED AMOUNT TOPICALLY TWICE DAILY FOR UP TO 2 WEEKS AS NEEDED.  Dispense: 30 g; Refill: 0  2. Hidradenitis suppurativa She reports recent normal blood work with her primary. Discussed weight loss Not a candidate for OCP's - doxycycline (ADOXA) 100 MG tablet; Take 1 tablet (100 mg total) by mouth daily.  Dispense: 90 tablet; Refill: 0 -F/U in 3 months, if not improving will refer to Dermatology

## 2021-03-03 LAB — SURESWAB® ADVANCED VAGINITIS,TMA
CANDIDA SPECIES: NOT DETECTED
Candida glabrata: NOT DETECTED
SURESWAB(R) ADV BACTERIAL VAGINOSIS(BV),TMA: NEGATIVE
TRICHOMONAS VAGINALIS (TV),TMA: NOT DETECTED

## 2021-04-06 DIAGNOSIS — Z1231 Encounter for screening mammogram for malignant neoplasm of breast: Secondary | ICD-10-CM | POA: Diagnosis not present

## 2021-04-19 ENCOUNTER — Other Ambulatory Visit: Payer: Self-pay | Admitting: Obstetrics and Gynecology

## 2021-04-19 DIAGNOSIS — N760 Acute vaginitis: Secondary | ICD-10-CM

## 2021-04-19 NOTE — Telephone Encounter (Signed)
Left message for patient to call to see if refill is needed or was this request created by pharmacy. ?

## 2021-04-19 NOTE — Telephone Encounter (Signed)
Patient called back and said she does need refill ointment.  ? ?Last annual exam was 09/2021 ?

## 2021-04-20 NOTE — Telephone Encounter (Signed)
Please call and speak to the patient. Let her know that I did refill the steroid ointment for her, but if she is having ongoing issues she should be reevaluated. Make sure she understands that steroid ointments are not for daily, long term use.  ?

## 2021-04-21 ENCOUNTER — Encounter: Payer: Self-pay | Admitting: Obstetrics and Gynecology

## 2021-04-21 NOTE — Telephone Encounter (Signed)
Left detailed message on voicemail per DPR access.  

## 2021-04-26 ENCOUNTER — Encounter (HOSPITAL_COMMUNITY): Payer: Self-pay

## 2021-05-30 ENCOUNTER — Ambulatory Visit (INDEPENDENT_AMBULATORY_CARE_PROVIDER_SITE_OTHER): Payer: BC Managed Care – PPO | Admitting: Obstetrics and Gynecology

## 2021-05-30 ENCOUNTER — Encounter: Payer: Self-pay | Admitting: Obstetrics and Gynecology

## 2021-05-30 VITALS — BP 130/72 | HR 67 | Ht 68.0 in | Wt 232.0 lb

## 2021-05-30 DIAGNOSIS — L732 Hidradenitis suppurativa: Secondary | ICD-10-CM

## 2021-05-30 MED ORDER — CLINDAMYCIN PHOSPHATE 1 % EX GEL: Freq: Two times a day (BID) | CUTANEOUS | 2 refills | Status: DC

## 2021-05-30 NOTE — Progress Notes (Signed)
GYNECOLOGY  VISIT ?  ?HPI: ?41 y.o.   Single White or Caucasian Not Hispanic or Latino  female   ?G0P0000 with Patient's last menstrual period was 05/25/2021.   ?here for a follow up for hidradenitis suppurativa. In 2/23 she was started doxycycline 100 mg daily for issues with recurrent boils in the genital area, mons, under breast and in her axilla's. Her boils have all resolved on the doxycycline.  ? ?GYNECOLOGIC HISTORY: ?Patient's last menstrual period was 05/25/2021. ?Contraception:none  ?Menopausal hormone therapy: none  ?       ?OB History   ? ? Gravida  ?0  ? Para  ?0  ? Term  ?0  ? Preterm  ?0  ? AB  ?0  ? Living  ?0  ?  ? ? SAB  ?0  ? IAB  ?0  ? Ectopic  ?0  ? Multiple  ?0  ? Live Births  ?   ?   ?  ?  ?    ? ?There are no problems to display for this patient. ? ? ?Past Medical History:  ?Diagnosis Date  ? Anemia   ? GERD (gastroesophageal reflux disease)   ? Hypertension   ? ? ?Past Surgical History:  ?Procedure Laterality Date  ? BREAST LUMPECTOMY WITH RADIOACTIVE SEED LOCALIZATION Right 07/07/2020  ? Procedure: RIGHT BREAST LUMPECTOMY WITH RADIOACTIVE SEED LOCALIZATION;  Surgeon: Erroll Luna, MD;  Location: [place];  Service: General;  Laterality: Right;  ? NO PAST SURGERIES    ? ? ?Current Outpatient Medications  ?Medication Sig Dispense Refill  ? betamethasone valerate ointment (VALISONE) 0.1 % APPLY A PEA SIZED AMOUNT TOPICALLY TWICE DAILY FOR UP TO 2 WEEKS AS NEEDED. 30 g 0  ? Cholecalciferol (VITAMIN D3) 125 MCG (5000 UT) TABS Take 5,000 Units by mouth daily.    ? doxycycline (ADOXA) 100 MG tablet Take 1 tablet (100 mg total) by mouth daily. 90 tablet 0  ? fluticasone (FLONASE) 50 MCG/ACT nasal spray Place 1 spray into both nostrils daily as needed for allergies.    ? hydrochlorothiazide (HYDRODIURIL) 25 MG tablet Take 25 mg by mouth daily.    ? Multiple Vitamins-Minerals (HAIR SKIN NAILS PO) Take 2 capsules by mouth daily. Gummies    ? telmisartan (MICARDIS) 40 MG tablet Take 40 mg by mouth  daily.    ? ?No current facility-administered medications for this visit.  ?  ? ?ALLERGIES: Patient has no known allergies. ? ?Family History  ?Problem Relation Age of Onset  ? Hypertension Mother   ? Diabetes Mother   ? Depression Mother   ? Breast cancer Mother 61  ? Cancer Maternal Aunt   ?     throat and stomach cancer  ? Colon cancer Father 68  ?     in chemo now  ? Depression Sister   ? Heart attack Maternal Uncle   ? ? ?Social History  ? ?Socioeconomic History  ? Marital status: Single  ?  Spouse name: Not on file  ? Number of children: Not on file  ? Years of education: Not on file  ? Highest education level: Not on file  ?Occupational History  ? Not on file  ?Tobacco Use  ? Smoking status: Former  ? Smokeless tobacco: Never  ?Vaping Use  ? Vaping Use: Never used  ?Substance and Sexual Activity  ? Alcohol use: No  ? Drug use: No  ? Sexual activity: Not Currently  ?  Partners: Male  ?  Birth control/protection:  Pill  ?  Comment: nuvaring  ?Other Topics Concern  ? Not on file  ?Social History Narrative  ? Not on file  ? ?Social Determinants of Health  ? ?Financial Resource Strain: Not on file  ?Food Insecurity: Not on file  ?Transportation Needs: Not on file  ?Physical Activity: Not on file  ?Stress: Not on file  ?Social Connections: Not on file  ?Intimate Partner Violence: Not on file  ? ? ?Review of Systems  ?Eyes:  Positive for pain.  ?All other systems reviewed and are negative. ? ?PHYSICAL EXAMINATION:   ? ?BP 130/72   Pulse 67   Ht '5\' 8"'$  (1.727 m)   Wt 232 lb (105.2 kg)   LMP 05/25/2021   SpO2 100%   BMI 35.28 kg/m?     ?General appearance: alert, cooperative and appears stated age ? ?1. Hidradenitis suppurativa ?Resolved on 3 month course of doxycycline.  ?Will change to clindamycin gel ?F/U for annual exam in 9/23.  ?- clindamycin (CLINDAGEL) 1 % gel; Apply topically 2 (two) times daily.  Dispense: 60 g; Refill: 2 ? ? ?

## 2021-06-27 ENCOUNTER — Other Ambulatory Visit: Payer: Self-pay | Admitting: Obstetrics and Gynecology

## 2021-06-27 DIAGNOSIS — N760 Acute vaginitis: Secondary | ICD-10-CM

## 2021-08-11 ENCOUNTER — Ambulatory Visit (INDEPENDENT_AMBULATORY_CARE_PROVIDER_SITE_OTHER): Payer: BC Managed Care – PPO | Admitting: Radiology

## 2021-08-11 ENCOUNTER — Encounter: Payer: Self-pay | Admitting: Obstetrics and Gynecology

## 2021-08-11 VITALS — BP 122/76

## 2021-08-11 DIAGNOSIS — N763 Subacute and chronic vulvitis: Secondary | ICD-10-CM | POA: Diagnosis not present

## 2021-08-11 DIAGNOSIS — N898 Other specified noninflammatory disorders of vagina: Secondary | ICD-10-CM

## 2021-08-11 MED ORDER — CLOTRIMAZOLE-BETAMETHASONE 1-0.05 % EX CREA: 1.0000 | TOPICAL_CREAM | Freq: Two times a day (BID) | CUTANEOUS | 1 refills | Status: DC

## 2021-08-11 NOTE — Telephone Encounter (Signed)
Please schedule visit with provider for vaginal itching.

## 2021-08-11 NOTE — Progress Notes (Signed)
      Subjective: FLECIA SHUTTER is a 41 y.o. female who complains of vulvar redness and itching, no vaginal discharge, no odor---x's 1-2 months. No vaginal symptoms, all external. Has tried baby powder, Vaseline, ice packs, honey pot itch spray. Use betamethasone in the past which helped short turn.    Review of Systems  All other systems reviewed and are negative.   Today's Vitals   08/11/21 1318  BP: 122/76   There is no height or weight on file to calculate BMI.    Objective:  -General: normal, no distress -Vulva: erythema on the mons, labia majora and groin with irregular borders -Vagina: no lesions or discharge -Cervix: no lesion or discharge, no CMT -Perineum: no lesions   Chaperone offered and declined.  Assessment:/Plan:    1. Subacute vulvitis  - clotrimazole-betamethasone (LOTRISONE) cream; Apply 1 Application topically 2 (two) times daily.  Dispense: 30 g; Refill: 1     Keep area clean and dry. Dove white bar soap to cleanse externally. Avoid body washes. Allow adequate air flow.

## 2021-08-30 IMAGING — MG MM BREAST LOCALIZATION CLIP
4 series · 4 of 12 positions shown · non-contrast
Comparison: Previous exam(s).

CLINICAL DATA: Status post stereotactic biopsy for architectural
distortion within the inner RIGHT breast.

EXAM:
DIAGNOSTIC RIGHT MAMMOGRAM POST STEREOTACTIC BIOPSY

[R ML synth-2D]
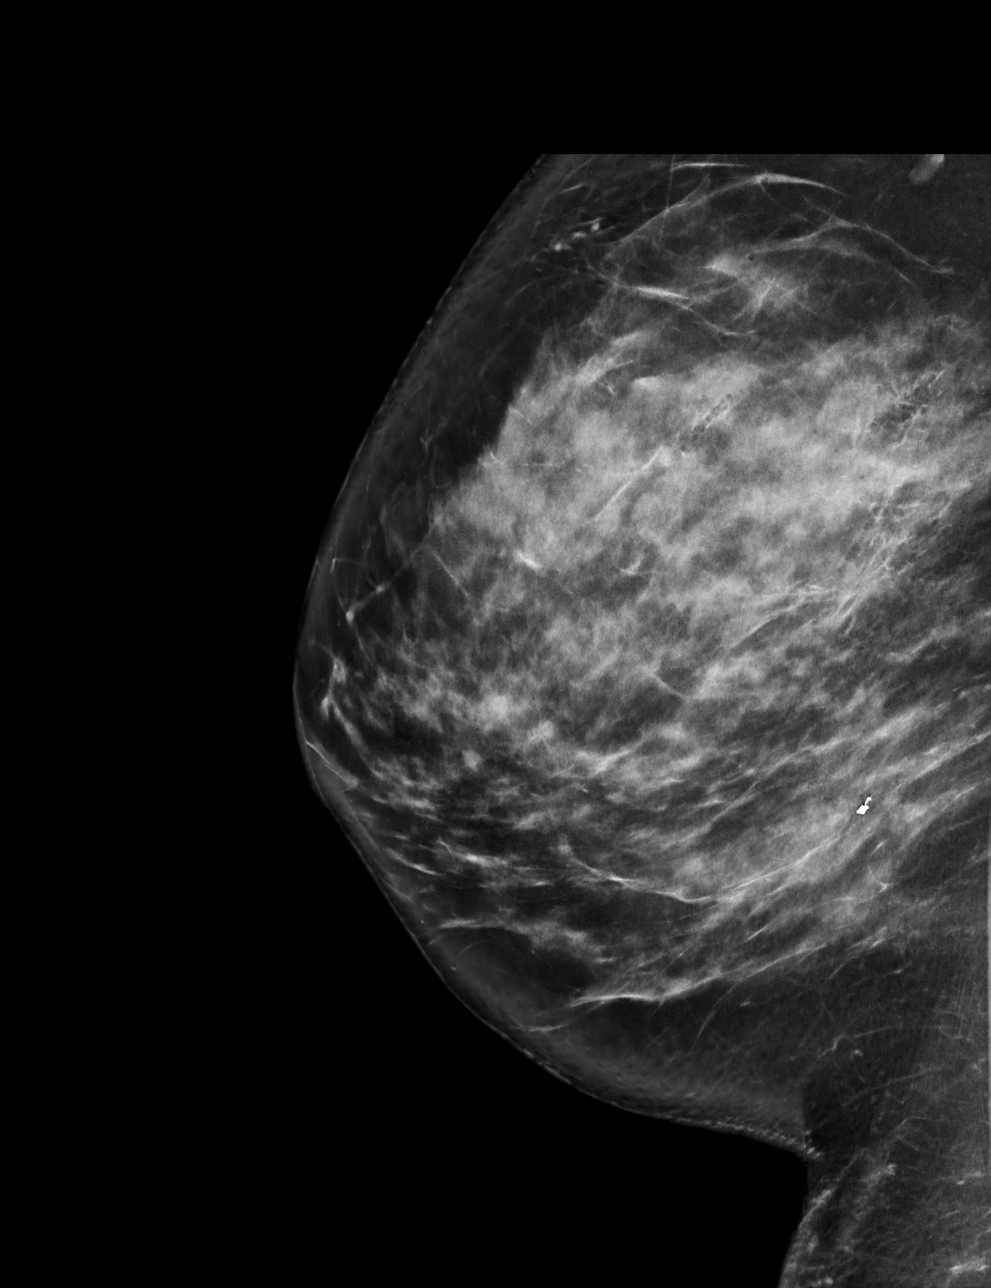

[R CC synth-2D]
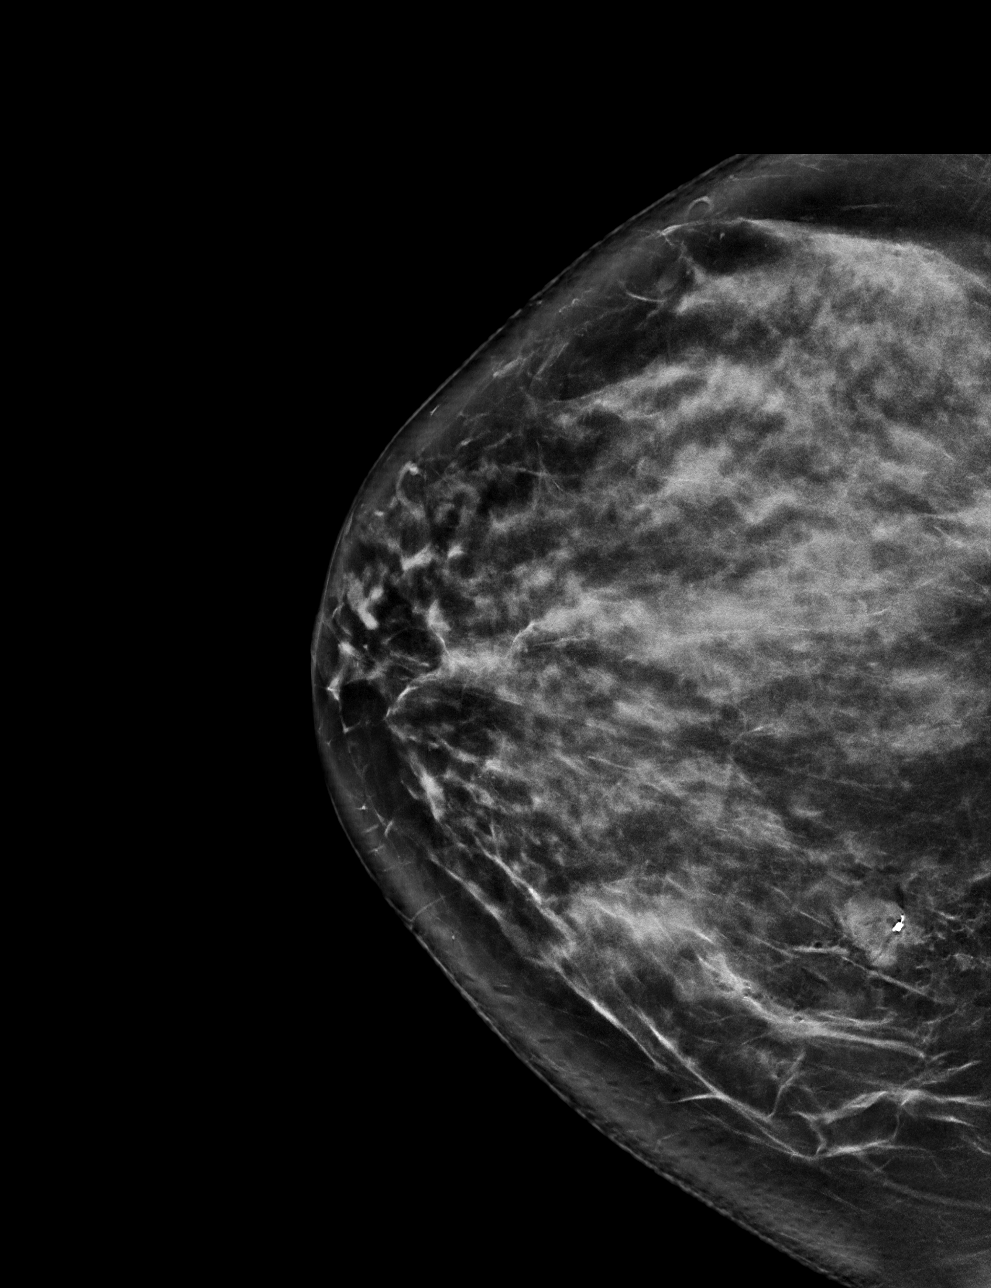

[R ML tomo · tomo slice 43/86.0]
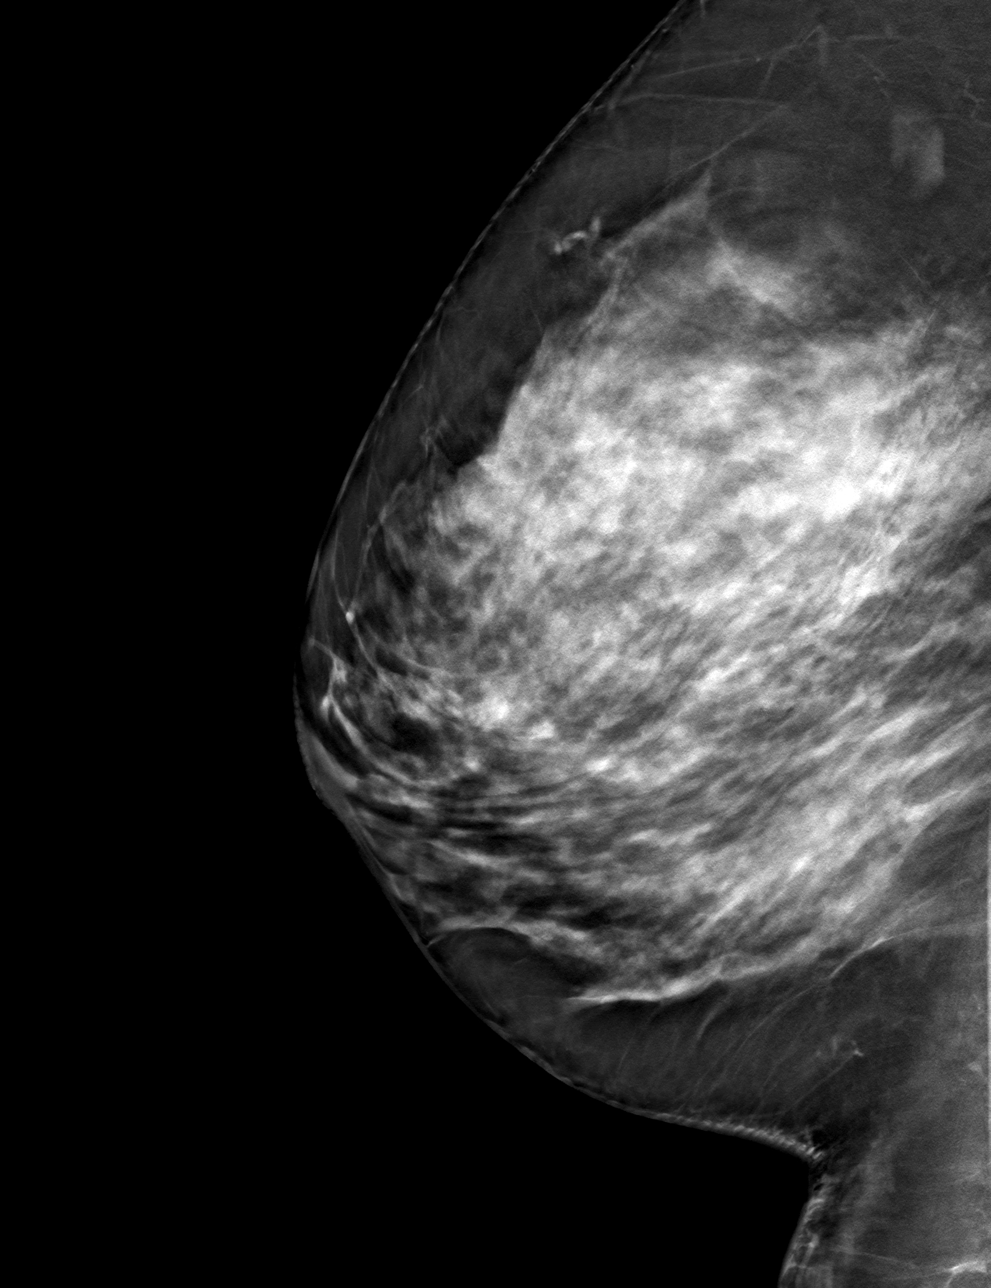

[R CC tomo · tomo slice 39/76.0]
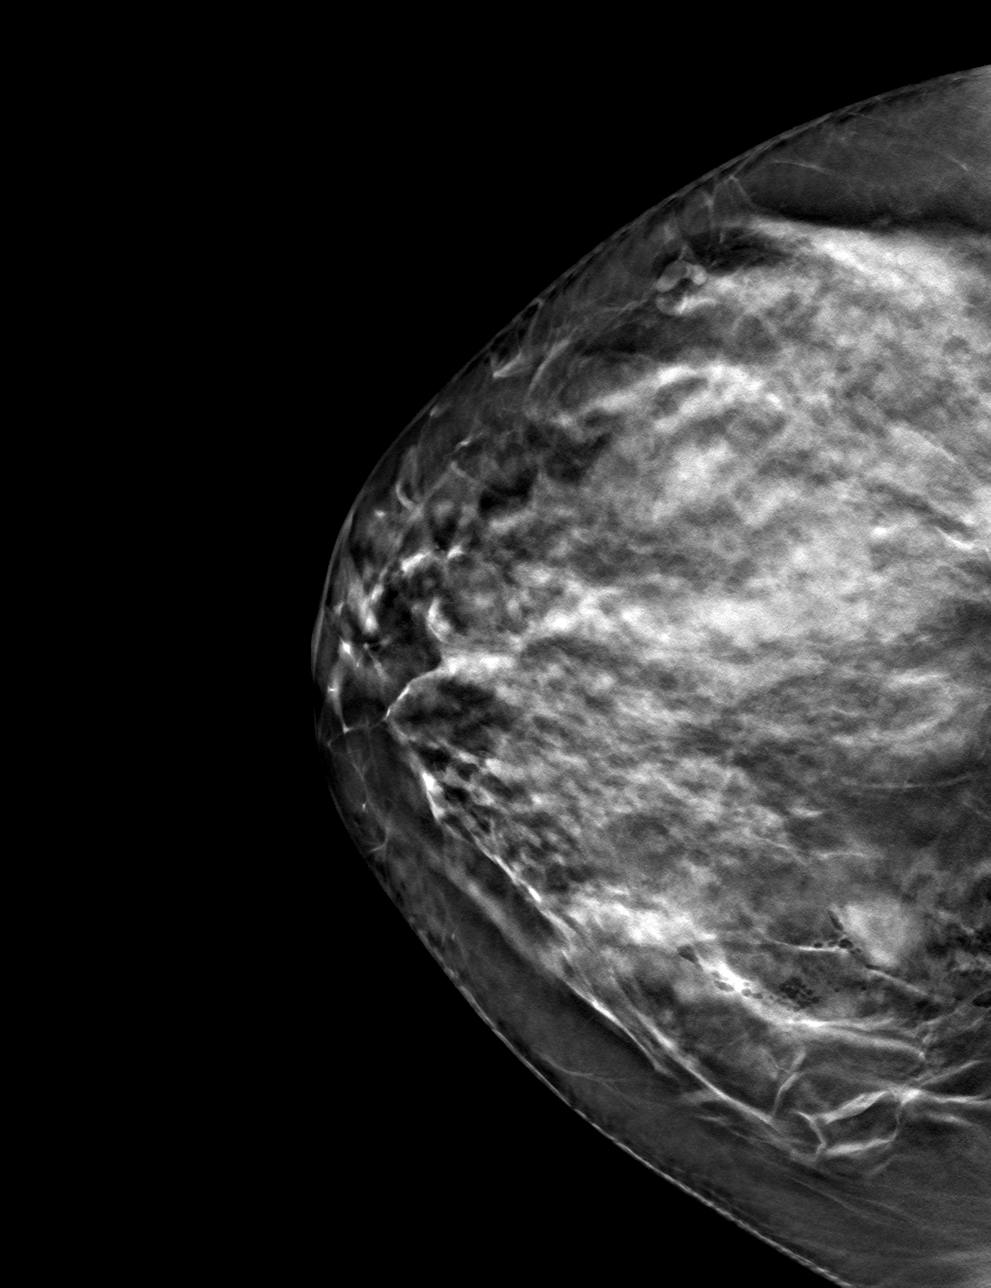

[4 of 12 positions shown; findings below may reference images not displayed]

FINDINGS: Mammographic images were obtained following stereotactic guided
biopsy of architectural distortion within the lower inner quadrant
of the RIGHT breast, at posterior depth. The biopsy marking clip is
in expected position at the site of biopsy.
IMPRESSION: Appropriate positioning of the coil shaped biopsy marking clip at
the site of biopsy in the lower inner quadrant of the RIGHT breast
at posterior depth.

Final Assessment: Post Procedure Mammograms for Marker Placement

## 2021-09-15 DIAGNOSIS — M5416 Radiculopathy, lumbar region: Secondary | ICD-10-CM | POA: Diagnosis not present

## 2021-09-15 DIAGNOSIS — M545 Low back pain, unspecified: Secondary | ICD-10-CM | POA: Diagnosis not present

## 2021-09-20 NOTE — Progress Notes (Signed)
41 y.o. G0P0000 Single White or Caucasian Not Hispanic or Latino female here for annual exam.   Period Cycle (Days): 28 Period Duration (Days): 4-5 Period Pattern: Regular Menstrual Flow: Heavy Menstrual Control: Tampon (super tampon) Menstrual Control Change Freq (Hours): 2 Dysmenorrhea: (!) Mild Dysmenorrhea Symptoms: Cramping, Headache  Patient's last menstrual period was 09/11/2021.          Sexually active: No.  The current method of family planning is abstinence.    Exercising: Yes.    The patient does not participate in regular exercise at present. Smoker:  no  Health Maintenance: Pap:  7/14/20WNL HR HPV Neg,  06-01-16 neg History of abnormal Pap:  no MMG:   see epic , breast biopsy in 5/22, lumpectomy in 6/22. She reports having a normal mammogram in 2/23 at [place].  BMD:   none Colonoscopy: none TDaP:  05/20/13 Gardasil: complete.    reports that she has quit smoking. She has never used smokeless tobacco. She reports that she does not drink alcohol and does not use drugs. Works in a factory.   Past Medical History:  Diagnosis Date   Anemia    GERD (gastroesophageal reflux disease)    Hypertension     Past Surgical History:  Procedure Laterality Date   BREAST LUMPECTOMY WITH RADIOACTIVE SEED LOCALIZATION Right 07/07/2020   Procedure: RIGHT BREAST LUMPECTOMY WITH RADIOACTIVE SEED LOCALIZATION;  Surgeon: Erroll Luna, MD;  Location: [place];  Service: General;  Laterality: Right;   NO PAST SURGERIES      Current Outpatient Medications  Medication Sig Dispense Refill   Cholecalciferol (VITAMIN D3) 125 MCG (5000 UT) TABS Take 5,000 Units by mouth daily.     clindamycin (CLINDAGEL) 1 % gel Apply topically 2 (two) times daily. 60 g 2   clotrimazole-betamethasone (LOTRISONE) cream Apply 1 Application topically 2 (two) times daily. 30 g 1   diclofenac (VOLTAREN) 75 MG EC tablet Take 75 mg by mouth 2 (two) times daily.     fluticasone (FLONASE) 50 MCG/ACT nasal spray  Place 1 spray into both nostrils daily as needed for allergies.     hydrochlorothiazide (HYDRODIURIL) 25 MG tablet Take 25 mg by mouth daily.     Multiple Vitamins-Minerals (HAIR SKIN NAILS PO) Take 2 capsules by mouth daily. Gummies     No current facility-administered medications for this visit.    Family History  Problem Relation Age of Onset   Hypertension Mother    Diabetes Mother    Depression Mother    Breast cancer Mother 72   Cancer Maternal Aunt        throat and stomach cancer   Colon cancer Father 24       in chemo now   Depression Sister    Heart attack Maternal Uncle     Review of Systems  All other systems reviewed and are negative.   Exam:   BP 132/78   Pulse 88   Ht '5\' 8"'$  (1.727 m)   Wt 227 lb (103 kg)   LMP 09/11/2021   SpO2 99%   BMI 34.52 kg/m   Weight change: '@WEIGHTCHANGE'$ @ Height:   Height: '5\' 8"'$  (172.7 cm)  Ht Readings from Last 3 Encounters:  09/28/21 '5\' 8"'$  (1.727 m)  05/30/21 '5\' 8"'$  (1.727 m)  03/02/21 '5\' 8"'$  (1.727 m)    General appearance: alert, cooperative and appears stated age Head: Normocephalic, without obvious abnormality, atraumatic Neck: no adenopathy, supple, symmetrical, trachea midline and thyroid normal to inspection and palpation Lungs: clear  to auscultation bilaterally Cardiovascular: regular rate and rhythm Breasts: normal appearance, no masses or tenderness Abdomen: soft, non-tender; non distended,  no masses,  no organomegaly Extremities: extremities normal, atraumatic, no cyanosis or edema Skin: Skin color, texture, turgor normal. No rashes or lesions Lymph nodes: Cervical, supraclavicular, and axillary nodes normal. No abnormal inguinal nodes palpated Neurologic: Grossly normal   Pelvic: External genitalia:  no lesions              Urethra:  normal appearing urethra with no masses, tenderness or lesions              Bartholins and Skenes: normal                 Vagina: normal appearing vagina with normal color and  discharge, no lesions              Cervix: no lesions               Bimanual Exam:  Uterus:   no masses or tenderness              Adnexa: no mass, fullness, tenderness               Rectovaginal: Confirms               Anus:  normal sphincter tone, no lesions  Gae Dry, CMA chaperoned for the exam.  1. Well woman exam No pap this year Will get a copy of her last mammogram Labs with primary Discussed breast self exam Discussed calcium and vit D intake

## 2021-09-22 DIAGNOSIS — M545 Low back pain, unspecified: Secondary | ICD-10-CM | POA: Diagnosis not present

## 2021-09-28 ENCOUNTER — Encounter: Payer: Self-pay | Admitting: Obstetrics and Gynecology

## 2021-09-28 ENCOUNTER — Ambulatory Visit (INDEPENDENT_AMBULATORY_CARE_PROVIDER_SITE_OTHER): Payer: BC Managed Care – PPO | Admitting: Obstetrics and Gynecology

## 2021-09-28 VITALS — BP 132/78 | HR 88 | Ht 68.0 in | Wt 227.0 lb

## 2021-09-28 DIAGNOSIS — Z01419 Encounter for gynecological examination (general) (routine) without abnormal findings: Secondary | ICD-10-CM

## 2021-09-28 NOTE — Patient Instructions (Signed)

## 2021-10-24 ENCOUNTER — Other Ambulatory Visit: Payer: Self-pay | Admitting: Radiology

## 2021-10-24 DIAGNOSIS — N763 Subacute and chronic vulvitis: Secondary | ICD-10-CM

## 2021-11-01 DIAGNOSIS — M5489 Other dorsalgia: Secondary | ICD-10-CM | POA: Diagnosis not present

## 2021-11-01 DIAGNOSIS — M5431 Sciatica, right side: Secondary | ICD-10-CM | POA: Diagnosis not present

## 2021-11-01 DIAGNOSIS — M2560 Stiffness of unspecified joint, not elsewhere classified: Secondary | ICD-10-CM | POA: Diagnosis not present

## 2021-11-01 DIAGNOSIS — M6281 Muscle weakness (generalized): Secondary | ICD-10-CM | POA: Diagnosis not present

## 2021-11-25 DIAGNOSIS — R030 Elevated blood-pressure reading, without diagnosis of hypertension: Secondary | ICD-10-CM | POA: Diagnosis not present

## 2021-11-28 ENCOUNTER — Other Ambulatory Visit: Payer: Self-pay | Admitting: Radiology

## 2021-11-28 DIAGNOSIS — N763 Subacute and chronic vulvitis: Secondary | ICD-10-CM

## 2021-11-29 NOTE — Telephone Encounter (Signed)
Call placed to patient. Left detailed message, ok per dpr. Advised per Dr. Talbert Nan. Return call to office to schedule OV or if any additional questions.    Encounter closed.

## 2021-11-29 NOTE — Telephone Encounter (Signed)
If she is having on going vaginitis symptoms she should be evaluated. Too much steroid ointment can thin the skin.  Please schedule an appointment.

## 2021-11-29 NOTE — Telephone Encounter (Signed)
Med refill request: Lotrisone Cream  Last AEX: 09/28/21 -JJ Next AEX: 10/02/22 -JJ Last MMG (if hormonal med) N/A  Last Rx sent 08/11/21 30 g/ 1 RF for subacute vulvitis  Refill authorized: Please Advise?

## 2021-12-06 DIAGNOSIS — I10 Essential (primary) hypertension: Secondary | ICD-10-CM | POA: Diagnosis not present

## 2021-12-06 DIAGNOSIS — Z6835 Body mass index (BMI) 35.0-35.9, adult: Secondary | ICD-10-CM | POA: Diagnosis not present

## 2021-12-06 DIAGNOSIS — R0989 Other specified symptoms and signs involving the circulatory and respiratory systems: Secondary | ICD-10-CM | POA: Diagnosis not present

## 2021-12-30 DIAGNOSIS — R509 Fever, unspecified: Secondary | ICD-10-CM | POA: Diagnosis not present

## 2021-12-30 DIAGNOSIS — J069 Acute upper respiratory infection, unspecified: Secondary | ICD-10-CM | POA: Diagnosis not present

## 2021-12-30 DIAGNOSIS — J029 Acute pharyngitis, unspecified: Secondary | ICD-10-CM | POA: Diagnosis not present

## 2021-12-30 DIAGNOSIS — R051 Acute cough: Secondary | ICD-10-CM | POA: Diagnosis not present

## 2021-12-30 DIAGNOSIS — R0981 Nasal congestion: Secondary | ICD-10-CM | POA: Diagnosis not present

## 2022-03-16 DIAGNOSIS — Z0000 Encounter for general adult medical examination without abnormal findings: Secondary | ICD-10-CM | POA: Diagnosis not present

## 2022-03-16 DIAGNOSIS — Z13220 Encounter for screening for lipoid disorders: Secondary | ICD-10-CM | POA: Diagnosis not present

## 2022-03-16 DIAGNOSIS — Z6835 Body mass index (BMI) 35.0-35.9, adult: Secondary | ICD-10-CM | POA: Diagnosis not present

## 2022-03-16 DIAGNOSIS — Z131 Encounter for screening for diabetes mellitus: Secondary | ICD-10-CM | POA: Diagnosis not present

## 2022-04-10 DIAGNOSIS — Z1231 Encounter for screening mammogram for malignant neoplasm of breast: Secondary | ICD-10-CM | POA: Diagnosis not present

## 2022-10-02 ENCOUNTER — Ambulatory Visit (INDEPENDENT_AMBULATORY_CARE_PROVIDER_SITE_OTHER): Payer: BC Managed Care – PPO | Admitting: Obstetrics and Gynecology

## 2022-10-02 ENCOUNTER — Ambulatory Visit: Payer: BC Managed Care – PPO | Admitting: Obstetrics and Gynecology

## 2022-10-02 ENCOUNTER — Encounter: Payer: Self-pay | Admitting: Obstetrics and Gynecology

## 2022-10-02 ENCOUNTER — Other Ambulatory Visit (HOSPITAL_COMMUNITY)
Admission: RE | Admit: 2022-10-02 | Discharge: 2022-10-02 | Disposition: A | Discharge status: Home / Self Care | Payer: BC Managed Care – PPO | Source: Ambulatory Visit | Attending: Obstetrics and Gynecology | Admitting: Obstetrics and Gynecology

## 2022-10-02 VITALS — BP 120/78 | HR 86 | Ht 67.25 in | Wt 210.0 lb

## 2022-10-02 DIAGNOSIS — Z01419 Encounter for gynecological examination (general) (routine) without abnormal findings: Secondary | ICD-10-CM

## 2022-10-02 DIAGNOSIS — Z1231 Encounter for screening mammogram for malignant neoplasm of breast: Secondary | ICD-10-CM

## 2022-10-02 NOTE — Progress Notes (Signed)
Chelsea Abbott   42 y.o. y.o. female here for annual exam. She denies any bothersome bleeding. She reports she has a couple days of moderate bleeding with small clots then lighter bleeding after that. She denies any dysmenorrhea  She is not on any birth control   Blood pressure 120/78, pulse 86, height 5' 7.25" (1.708 m), weight 210 lb (95.3 kg), last menstrual period 09/14/2022, SpO2 98%.     Component Value Date/Time   DIAGPAP  07/30/2018 0000    NEGATIVE FOR INTRAEPITHELIAL LESIONS OR MALIGNANCY.   ADEQPAP  07/30/2018 0000    Satisfactory for evaluation  endocervical/transformation zone component PRESENT.    GYN HISTORY:    Component Value Date/Time   DIAGPAP  07/30/2018 0000    NEGATIVE FOR INTRAEPITHELIAL LESIONS OR MALIGNANCY.   ADEQPAP  07/30/2018 0000    Satisfactory for evaluation  endocervical/transformation zone component PRESENT.    OB History  Gravida Para Term Preterm AB Living  0 0 0 0 0 0  SAB IAB Ectopic Multiple Live Births  0 0 0 0      Past Medical History:  Diagnosis Date   Anemia    GERD (gastroesophageal reflux disease)    Hypertension     Past Surgical History:  Procedure Laterality Date   BREAST LUMPECTOMY WITH RADIOACTIVE SEED LOCALIZATION Right 07/07/2020   Procedure: RIGHT BREAST LUMPECTOMY WITH RADIOACTIVE SEED LOCALIZATION;  Surgeon: Harriette Bouillon, MD;  Location: [facility];  Service: General;  Laterality: Right;   NO PAST SURGERIES      Current Outpatient Medications on File Prior to Visit  Medication Sig Dispense Refill   Cholecalciferol (VITAMIN D3) 125 MCG (5000 UT) TABS Take 5,000 Units by mouth daily.     lisinopril-hydrochlorothiazide (ZESTORETIC) 20-25 MG tablet Take 1 tablet by mouth daily.     Multiple Vitamins-Minerals (HAIR SKIN NAILS PO) Take 2 capsules by mouth daily. Gummies     No current facility-administered medications on file prior to visit.    Social History   Socioeconomic History   Marital status: Single    Spouse name:  Not on file   Number of children: Not on file   Years of education: Not on file   Highest education level: Not on file  Occupational History   Not on file  Tobacco Use   Smoking status: Former   Smokeless tobacco: Never  Vaping Use   Vaping status: Never Used  Substance and Sexual Activity   Alcohol use: No   Drug use: No   Sexual activity: Not Currently    Partners: Male    Birth control/protection: Abstinence  Other Topics Concern   Not on file  Social History Narrative   Not on file   Social Determinants of Health   Financial Resource Strain: Not on file  Food Insecurity: Not on file  Transportation Needs: Not on file  Physical Activity: Not on file  Stress: Not on file  Social Connections: Not on file  Intimate Partner Violence: Not on file    Family History  Problem Relation Age of Onset   Hypertension Mother    Diabetes Mother    Depression Mother    Breast cancer Mother 20   Cancer Maternal Aunt        throat and stomach cancer   Colon cancer Father 96       in chemo now   Depression Sister    Heart attack Maternal Uncle      No Known Allergies    Patient's  last menstrual period was Patient's last menstrual period was 09/14/2022 (exact date)..            Review of Systems Alls systems reviewed and are negative.     PE General appearance: alert, cooperative and appears stated age Head: Normocephalic, without obvious abnormality, atraumatic Neck: no adenopathy, supple, symmetrical, trachea midline and thyroid normal to inspection and palpation Lungs: clear to auscultation bilaterally Breasts: normal appearance, no masses or tenderness Heart: regular rate and rhythm Abdomen: soft, non-tender; bowel sounds normal; no masses,  no organomegaly Extremities: extremities normal, atraumatic, no cyanosis or edema Skin: Skin color, texture, turgor normal. No rashes or lesions Lymph nodes: Cervical, supraclavicular, and axillary nodes normal. No  abnormal inguinal nodes palpated Neurologic: Grossly normal     Pelvic: External genitalia:  no lesions              Urethra:  normal appearing urethra with no masses, tenderness or lesions              Bartholins and Skenes: normal                 Vagina: normal appearing vagina with normal color and discharge, no lesions.               Cervix: no lesions, no cervical motion tenderness               Bimanual Exam:  Uterus:  normal size, contour, position, consistency, mobility, non-tender              Adnexa: no mass, fullness, tenderness          Chaperone was present for exam.   A:         Well Woman GYN exam                             P:        Pap smear collected             Encouraged annual mammogram screening    Labs and immunizations with her primary             Discussed breast self exams             Encouraged safe sexual practices and enouraged healthy lifestyle practices with diet and exercise  Earley Favor

## 2022-10-09 LAB — CYTOLOGY - PAP
Adequacy: ABSENT
Diagnosis: NEGATIVE

## 2023-01-30 DIAGNOSIS — J209 Acute bronchitis, unspecified: Secondary | ICD-10-CM | POA: Diagnosis not present

## 2023-01-30 DIAGNOSIS — J019 Acute sinusitis, unspecified: Secondary | ICD-10-CM | POA: Diagnosis not present

## 2023-04-05 DIAGNOSIS — Z6835 Body mass index (BMI) 35.0-35.9, adult: Secondary | ICD-10-CM | POA: Diagnosis not present

## 2023-04-05 DIAGNOSIS — Z0000 Encounter for general adult medical examination without abnormal findings: Secondary | ICD-10-CM | POA: Diagnosis not present

## 2023-04-05 DIAGNOSIS — Z1331 Encounter for screening for depression: Secondary | ICD-10-CM | POA: Diagnosis not present

## 2023-04-06 DIAGNOSIS — Z13220 Encounter for screening for lipoid disorders: Secondary | ICD-10-CM | POA: Diagnosis not present

## 2023-04-06 DIAGNOSIS — Z131 Encounter for screening for diabetes mellitus: Secondary | ICD-10-CM | POA: Diagnosis not present

## 2023-04-06 DIAGNOSIS — Z0000 Encounter for general adult medical examination without abnormal findings: Secondary | ICD-10-CM | POA: Diagnosis not present

## 2023-04-11 DIAGNOSIS — Z1231 Encounter for screening mammogram for malignant neoplasm of breast: Secondary | ICD-10-CM | POA: Diagnosis not present

## 2023-06-06 DIAGNOSIS — R928 Other abnormal and inconclusive findings on diagnostic imaging of breast: Secondary | ICD-10-CM | POA: Diagnosis not present

## 2023-06-26 ENCOUNTER — Encounter: Payer: Self-pay | Admitting: Obstetrics and Gynecology

## 2023-06-26 DIAGNOSIS — L732 Hidradenitis suppurativa: Secondary | ICD-10-CM

## 2023-06-27 NOTE — Addendum Note (Signed)
 Addended by: Shamaria Kavan N on: 06/27/2023 03:04 PM   Modules accepted: Orders

## 2023-06-27 NOTE — Telephone Encounter (Signed)
 Referral pended.   Please confirm Dx for referral

## 2023-07-06 ENCOUNTER — Encounter: Payer: Self-pay | Admitting: Obstetrics and Gynecology

## 2023-07-06 ENCOUNTER — Ambulatory Visit (INDEPENDENT_AMBULATORY_CARE_PROVIDER_SITE_OTHER): Payer: Self-pay | Admitting: Obstetrics and Gynecology

## 2023-07-06 VITALS — BP 124/78 | HR 81

## 2023-07-06 DIAGNOSIS — N898 Other specified noninflammatory disorders of vagina: Secondary | ICD-10-CM | POA: Diagnosis not present

## 2023-07-06 DIAGNOSIS — L732 Hidradenitis suppurativa: Secondary | ICD-10-CM

## 2023-07-06 MED ORDER — DOXYCYCLINE HYCLATE 100 MG PO CAPS: 100.0000 mg | ORAL_CAPSULE | Freq: Two times a day (BID) | ORAL | 0 refills | Status: DC

## 2023-07-06 NOTE — Addendum Note (Signed)
 Addended by: Reinaldo Caras on: 07/06/2023 12:02 PM   Modules accepted: Orders

## 2023-07-06 NOTE — Progress Notes (Signed)
   Acute Office Visit  Subjective:    Patient ID: Chelsea Abbott, female    DOB: 1980-07-27, 43 y.o.   MRN: [id number]   HPI 43 y.o. presents today for Office Visit (vaginal itching and bumps on the thigh area. ) . Chronic pustules on mons and vulva b/l Underwears rubs against and makes it worse Can drain and bleed.  Has under breast and sometimes axillary region  Patient's last menstrual period was 06/22/2023 (exact date). Period Cycle (Days): 28 Period Duration (Days): 4-6 Period Pattern: Regular Menstrual Flow:  (Varies) Menstrual Control: Tampon Dysmenorrhea: (!) Mild Dysmenorrhea Symptoms: Cramping  Review of Systems     Objective:    Physical Exam Genitourinary:     Right Labia: lesions.     Left Labia: lesions.         BP 124/78 (BP Location: Left Arm, Patient Position: Sitting)   Pulse 81   LMP 06/22/2023 (Exact Date)   SpO2 97%  Wt Readings from Last 3 Encounters:  10/02/22 210 lb (95.3 kg)  09/28/21 227 lb (103 kg)  05/30/21 232 lb (105.2 kg)        Patient informed chaperone available to be present for breast and/or pelvic exam. Patient has requested no chaperone to be present. Patient has been advised what will be completed during breast and pelvic exam.   Assessment & Plan:  HS To begin course of doxy. Counseled on pregnancy risks, sun exposure and to not take on an empty stomach with the medication  Will need dermatology to manage and resume care. Referral placed.  Patient has started using hibiclens  soap and was encouraged to continue this.  Reinaldo Caras

## 2023-07-07 LAB — SURESWAB® ADVANCED VAGINITIS PLUS,TMA
C. trachomatis RNA, TMA: NOT DETECTED
CANDIDA SPECIES: DETECTED — AB
Candida glabrata: NOT DETECTED
N. gonorrhoeae RNA, TMA: NOT DETECTED
SURESWAB(R) ADV BACTERIAL VAGINOSIS(BV),TMA: NEGATIVE
TRICHOMONAS VAGINALIS (TV),TMA: NOT DETECTED

## 2023-07-09 ENCOUNTER — Ambulatory Visit: Payer: Self-pay | Admitting: Obstetrics and Gynecology

## 2023-07-09 MED ORDER — FLUCONAZOLE 150 MG PO TABS
150.0000 mg | ORAL_TABLET | Freq: Once | ORAL | 0 refills | Status: AC
Start: 2023-07-09 — End: 2023-07-09

## 2023-07-11 NOTE — Telephone Encounter (Signed)
 Renda -can you assist with scheduling?   I can update the referral to location of patients choice.

## 2023-07-26 MED ORDER — NYSTATIN 100000 UNIT/GM EX POWD: 1.0000 | Freq: Two times a day (BID) | CUTANEOUS | 0 refills | Status: AC

## 2023-07-26 NOTE — Addendum Note (Signed)
 Addended by: Tavaris Eudy N on: 07/26/2023 01:42 PM   Modules accepted: Orders

## 2023-07-26 NOTE — Addendum Note (Signed)
 Addended by: BRUTUS KATE SAILOR on: 07/26/2023 09:19 AM   Modules accepted: Orders

## 2023-07-26 NOTE — Telephone Encounter (Signed)
 Routing to [employer]

## 2023-08-03 ENCOUNTER — Other Ambulatory Visit: Payer: Self-pay | Admitting: Obstetrics and Gynecology

## 2023-08-03 DIAGNOSIS — L732 Hidradenitis suppurativa: Secondary | ICD-10-CM

## 2023-08-03 NOTE — Telephone Encounter (Signed)
.  Med refill request: Doxycycline  hyclate 100 mg Last AEX: 10/02/22 Next AEX:10/04/23 Last OV: 07/06/23 Last MMG (if hormonal med) 03/26/19 Refill authorized: Please Advise?

## 2023-09-07 ENCOUNTER — Other Ambulatory Visit: Payer: Self-pay | Admitting: Obstetrics and Gynecology

## 2023-09-07 DIAGNOSIS — L732 Hidradenitis suppurativa: Secondary | ICD-10-CM

## 2023-09-07 NOTE — Telephone Encounter (Signed)
 Med refill request: doxycycline  100 mg Last AEX: 10/02/22 Next AEX: 10/04/23 Last MMG (if hormonal med) n/a Last refill: 08/03/2023 Refill authorized: Please Advise?

## 2023-10-04 ENCOUNTER — Encounter: Payer: Self-pay | Admitting: Obstetrics and Gynecology

## 2023-10-04 ENCOUNTER — Ambulatory Visit (INDEPENDENT_AMBULATORY_CARE_PROVIDER_SITE_OTHER): Payer: BC Managed Care – PPO | Admitting: Obstetrics and Gynecology

## 2023-10-04 VITALS — BP 118/70 | HR 108 | Ht 66.73 in | Wt 245.4 lb

## 2023-10-04 DIAGNOSIS — Z1231 Encounter for screening mammogram for malignant neoplasm of breast: Secondary | ICD-10-CM

## 2023-10-04 DIAGNOSIS — Z23 Encounter for immunization: Secondary | ICD-10-CM | POA: Diagnosis not present

## 2023-10-04 DIAGNOSIS — Z1331 Encounter for screening for depression: Secondary | ICD-10-CM

## 2023-10-04 DIAGNOSIS — N898 Other specified noninflammatory disorders of vagina: Secondary | ICD-10-CM | POA: Diagnosis not present

## 2023-10-04 DIAGNOSIS — R1032 Left lower quadrant pain: Secondary | ICD-10-CM | POA: Diagnosis not present

## 2023-10-04 DIAGNOSIS — Z01419 Encounter for gynecological examination (general) (routine) without abnormal findings: Secondary | ICD-10-CM

## 2023-10-04 DIAGNOSIS — Z1211 Encounter for screening for malignant neoplasm of colon: Secondary | ICD-10-CM

## 2023-10-04 NOTE — Progress Notes (Signed)
 43 y.o. y.o. female here for annual exam. Patient's last menstrual period was 09/13/2023 (exact date). Period Cycle (Days): 28 Period Duration (Days): 5 Period Pattern: Regular Menstrual Flow: Moderate, Heavy, Light (mixture depending on the days) Menstrual Control: Tampon Dysmenorrhea: (!) Moderate Dysmenorrhea Symptoms: Cramping, Headache Gardesil completed MMG: 2025. To get records  Started a new job this year.  In school in the mornings to get her GED Labs: with PCP Does reports some LLQ pain that is mild and a history of ovarian cysts Pap smears 10/02/22 Neg, denies any abnormal pap smears  Body mass index is 38.74 kg/m.     10/04/2023    9:14 AM  Depression screen PHQ 2/9  Decreased Interest 0  Down, Depressed, Hopeless 0  PHQ - 2 Score 0    Blood pressure 118/70, pulse (!) 108, height 5' 6.73 (1.695 m), weight 245 lb 6.4 oz (111.3 kg), last menstrual period 09/13/2023, SpO2 97%.     Component Value Date/Time   DIAGPAP  10/02/2022 1528    - Negative for intraepithelial lesion or malignancy (NILM)   DIAGPAP  07/30/2018 0000    NEGATIVE FOR INTRAEPITHELIAL LESIONS OR MALIGNANCY.   ADEQPAP  10/02/2022 1528    Satisfactory for evaluation; transformation zone component ABSENT.   ADEQPAP  07/30/2018 0000    Satisfactory for evaluation  endocervical/transformation zone component PRESENT.    GYN HISTORY:    Component Value Date/Time   DIAGPAP  10/02/2022 1528    - Negative for intraepithelial lesion or malignancy (NILM)   DIAGPAP  07/30/2018 0000    NEGATIVE FOR INTRAEPITHELIAL LESIONS OR MALIGNANCY.   ADEQPAP  10/02/2022 1528    Satisfactory for evaluation; transformation zone component ABSENT.   ADEQPAP  07/30/2018 0000    Satisfactory for evaluation  endocervical/transformation zone component PRESENT.    OB History  Gravida Para Term Preterm AB Living  0 0 0 0 0 0  SAB IAB Ectopic Multiple Live Births  0 0 0 0     Past Medical History:  Diagnosis  Date   Anemia    GERD (gastroesophageal reflux disease)    Hypertension     Past Surgical History:  Procedure Laterality Date   BREAST LUMPECTOMY WITH RADIOACTIVE SEED LOCALIZATION Right 07/07/2020   Procedure: RIGHT BREAST LUMPECTOMY WITH RADIOACTIVE SEED LOCALIZATION;  Surgeon: Vanderbilt Ned, MD;  Location: [facility] OR;  Service: General;  Laterality: Right;   NO PAST SURGERIES      Current Outpatient Medications on File Prior to Visit  Medication Sig Dispense Refill   Cholecalciferol (VITAMIN D3) 125 MCG (5000 UT) TABS Take 5,000 Units by mouth daily.     doxycycline  (VIBRAMYCIN ) 100 MG capsule TAKE 1 CAPSULE BY MOUTH TWICE A DAY 60 capsule 0   lisinopril-hydrochlorothiazide (ZESTORETIC) 20-25 MG tablet Take 1 tablet by mouth daily.     No current facility-administered medications on file prior to visit.    Social History   Socioeconomic History   Marital status: Single    Spouse name: Not on file   Number of children: Not on file   Years of education: Not on file   Highest education level: Not on file  Occupational History   Not on file  Tobacco Use   Smoking status: Former   Smokeless tobacco: Never  Vaping Use   Vaping status: Never Used  Substance and Sexual Activity   Alcohol use: No   Drug use: No   Sexual activity: Not Currently    Partners: Male  Birth control/protection: Abstinence  Other Topics Concern   Not on file  Social History Narrative   Not on file   Social Drivers of Health   Financial Resource Strain: Not on file  Food Insecurity: Not on file  Transportation Needs: Not on file  Physical Activity: Not on file  Stress: Not on file  Social Connections: Not on file  Intimate Partner Violence: Not on file    Family History  Problem Relation Age of Onset   Hypertension Mother    Diabetes Mother    Depression Mother    Breast cancer Mother 40   Cancer Maternal Aunt        throat and stomach cancer   Colon cancer Father 1       in chemo  now   Depression Sister    Heart attack Maternal Uncle      No Known Allergies    Patient's last menstrual period was Patient's last menstrual period was 09/13/2023 (exact date)..            Review of Systems Alls systems reviewed and are negative.     Physical Exam Constitutional:      Appearance: Normal appearance.  Genitourinary:     Vulva and urethral meatus normal.     No lesions in the vagina.     Genitourinary Comments: Possible yeast infection     Right Labia: No rash, lesions or skin changes.    Left Labia: No lesions, skin changes or rash.    Vaginal discharge present.     No vaginal tenderness.     No vaginal prolapse present.    No vaginal atrophy present.     Right Adnexa: not tender, not palpable and no mass present.    Left Adnexa: not tender, not palpable and no mass present.    No cervical motion tenderness or discharge.     Uterus is not enlarged, tender or irregular.  Breasts:    Right: Normal.     Left: Normal.  HENT:     Head: Normocephalic.  Neck:     Thyroid: No thyroid mass, thyromegaly or thyroid tenderness.  Cardiovascular:     Rate and Rhythm: Normal rate and regular rhythm.     Heart sounds: Normal heart sounds, S1 normal and S2 normal.  Pulmonary:     Effort: Pulmonary effort is normal.     Breath sounds: Normal breath sounds and air entry.  Abdominal:     General: There is no distension.     Palpations: Abdomen is soft. There is no mass.     Tenderness: There is no abdominal tenderness. There is no guarding or rebound.  Musculoskeletal:        General: Normal range of motion.     Cervical back: Full passive range of motion without pain, normal range of motion and neck supple. No tenderness.     Right lower leg: No edema.     Left lower leg: No edema.  Neurological:     Mental Status: She is alert.  Skin:    General: Skin is warm.  Psychiatric:        Mood and Affect: Mood normal.        Behavior: Behavior normal.         Thought Content: Thought content normal.  Vitals and nursing note reviewed. Exam conducted with a chaperone present.       A:         Well Woman GYN exam LLQ  pain                             P:        Pap smear collected today Encouraged annual mammogram screening Colon cancer screening referral placed today DXA not indicated Labs and immunizations to do with PMD. Flue shot here today Discussed breast self exams Encouraged healthy lifestyle practices RTC for PUS only to evaluate for LLQ pain  No follow-ups on file.  Chelsea Abbott

## 2023-10-05 LAB — SURESWAB® ADVANCED VAGINITIS PLUS,TMA
C. trachomatis RNA, TMA: NOT DETECTED
CANDIDA SPECIES: DETECTED — AB
Candida glabrata: NOT DETECTED
N. gonorrhoeae RNA, TMA: NOT DETECTED
SURESWAB(R) ADV BACTERIAL VAGINOSIS(BV),TMA: NEGATIVE
TRICHOMONAS VAGINALIS (TV),TMA: NOT DETECTED

## 2023-10-06 ENCOUNTER — Other Ambulatory Visit: Payer: Self-pay | Admitting: Obstetrics and Gynecology

## 2023-10-06 DIAGNOSIS — L732 Hidradenitis suppurativa: Secondary | ICD-10-CM

## 2023-10-08 ENCOUNTER — Ambulatory Visit: Payer: Self-pay | Admitting: Obstetrics and Gynecology

## 2023-10-08 MED ORDER — FLUCONAZOLE 150 MG PO TABS
150.0000 mg | ORAL_TABLET | Freq: Once | ORAL | 0 refills | Status: AC
Start: 2023-10-08 — End: 2023-10-08

## 2023-10-08 NOTE — Telephone Encounter (Signed)
.  Med refill request: doxycycline    Last AEX: 10/04/23 Next AEX: 10/08/24 Last MMG (if hormonal med) Refill authorized: Please Advise?

## 2023-10-19 DIAGNOSIS — L732 Hidradenitis suppurativa: Secondary | ICD-10-CM | POA: Diagnosis not present

## 2023-11-06 ENCOUNTER — Other Ambulatory Visit: Admitting: Obstetrics and Gynecology

## 2023-11-06 ENCOUNTER — Ambulatory Visit (INDEPENDENT_AMBULATORY_CARE_PROVIDER_SITE_OTHER)

## 2023-11-06 DIAGNOSIS — R1032 Left lower quadrant pain: Secondary | ICD-10-CM

## 2023-11-06 DIAGNOSIS — Z01419 Encounter for gynecological examination (general) (routine) without abnormal findings: Secondary | ICD-10-CM

## 2023-11-07 ENCOUNTER — Encounter: Payer: Self-pay | Admitting: Obstetrics and Gynecology

## 2023-11-15 ENCOUNTER — Encounter: Payer: Self-pay | Admitting: Obstetrics and Gynecology

## 2023-11-15 NOTE — Telephone Encounter (Signed)
 Form completed and printed to back nurses station to be signed and returned to patient.

## 2023-11-28 DIAGNOSIS — R0981 Nasal congestion: Secondary | ICD-10-CM | POA: Diagnosis not present

## 2023-11-28 DIAGNOSIS — R0789 Other chest pain: Secondary | ICD-10-CM | POA: Diagnosis not present

## 2023-11-28 DIAGNOSIS — R509 Fever, unspecified: Secondary | ICD-10-CM | POA: Diagnosis not present

## 2023-11-28 DIAGNOSIS — R059 Cough, unspecified: Secondary | ICD-10-CM | POA: Diagnosis not present

## 2023-12-26 DIAGNOSIS — Z6839 Body mass index (BMI) 39.0-39.9, adult: Secondary | ICD-10-CM | POA: Diagnosis not present

## 2023-12-26 DIAGNOSIS — M545 Low back pain, unspecified: Secondary | ICD-10-CM | POA: Diagnosis not present

## 2023-12-31 ENCOUNTER — Encounter: Payer: Self-pay | Admitting: Internal Medicine

## 2024-01-15 ENCOUNTER — Ambulatory Visit: Payer: Self-pay

## 2024-01-15 ENCOUNTER — Encounter: Payer: Self-pay | Admitting: Internal Medicine

## 2024-01-15 VITALS — Ht 66.0 in | Wt 252.8 lb

## 2024-01-15 DIAGNOSIS — Z1211 Encounter for screening for malignant neoplasm of colon: Secondary | ICD-10-CM

## 2024-01-15 MED ORDER — NA SULFATE-K SULFATE-MG SULF 17.5-3.13-1.6 GM/177ML PO SOLN: 1.0000 | Freq: Once | ORAL | 0 refills | Status: AC

## 2024-01-15 NOTE — Progress Notes (Signed)
 No egg or soy allergy known to patient  No issues known to pt with past sedation with any surgeries or procedures Patient denies ever being told they had issues or difficulty with intubation  No FH of Malignant Hyperthermia Pt is not on diet pills Pt is not on  home 02  Pt is not on blood thinners  Pt denies issues with constipation- AS NEEDED  No A fib or A flutter Have any cardiac testing pending--NO Pt can ambulate-INDEPENDENTLY Pt denies use of chewing tobacco Discussed diabetic I weight loss medication holds Discussed NSAID holds Checked BMI Pt instructed to use Single[employer] or GoodRx for a price reduction on prep  Patient's chart reviewed by Norleen Schillings CNRA prior to previsit and patient appropriate for the [facility].  Pre visit completed and red dot placed by patient's name on their procedure day (on provider's schedule).

## 2024-01-23 ENCOUNTER — Encounter: Payer: Self-pay | Admitting: Internal Medicine

## 2024-01-29 ENCOUNTER — Encounter: Payer: Self-pay | Admitting: Internal Medicine

## 2024-01-29 ENCOUNTER — Ambulatory Visit: Admitting: Internal Medicine

## 2024-01-29 VITALS — BP 115/73 | HR 86 | Temp 98.2°F | Resp 11 | Ht 66.0 in | Wt 252.8 lb

## 2024-01-29 DIAGNOSIS — Z1211 Encounter for screening for malignant neoplasm of colon: Secondary | ICD-10-CM

## 2024-01-29 DIAGNOSIS — K648 Other hemorrhoids: Secondary | ICD-10-CM | POA: Diagnosis not present

## 2024-01-29 MED ORDER — SODIUM CHLORIDE 0.9 % IV SOLN: 500.0000 mL | INTRAVENOUS | Status: DC

## 2024-01-29 NOTE — Progress Notes (Signed)
 HISTORY OF PRESENT ILLNESS:  Chelsea Abbott is a 44 y.o. female sent directly for screening colonoscopy.  No complaints  REVIEW OF SYSTEMS:  All non-GI ROS negative except for  Past Medical History:  Diagnosis Date   Anemia    GERD (gastroesophageal reflux disease)    Hidradenitis suppurativa    Hypertension     Past Surgical History:  Procedure Laterality Date   BREAST LUMPECTOMY WITH RADIOACTIVE SEED LOCALIZATION Right 07/07/2020   Procedure: RIGHT BREAST LUMPECTOMY WITH RADIOACTIVE SEED LOCALIZATION;  Surgeon: Vanderbilt Ned, MD;  Location: [facility] OR;  Service: General;  Laterality: Right;   NO PAST SURGERIES      Social History Chelsea Abbott  reports that she has quit smoking. Her smoking use included cigarettes. She has never used smokeless tobacco. She reports that she does not drink alcohol and does not use drugs.  family history includes Breast cancer (age of onset: 64) in her mother; Cancer in her maternal aunt; Depression in her mother and sister; Diabetes in her mother; Esophageal cancer in her maternal aunt; Heart attack in her maternal uncle; Hypertension in her mother; Prostate cancer in her father; Stomach cancer in her maternal aunt.  Allergies[1]     PHYSICAL EXAMINATION: Vital signs: BP 110/72   Temp 98.2 F (36.8 C)   Ht 5' 6 (1.676 m)   Wt 252 lb 12.8 oz (114.7 kg)   LMP 01/18/2024   SpO2 99%   BMI 40.80 kg/m  General: Well-developed, well-nourished, no acute distress HEENT: Sclerae are anicteric, conjunctiva pink. Oral mucosa intact Lungs: Clear Heart: Regular Abdomen: soft, nontender, nondistended, no obvious ascites, no peritoneal signs, normal bowel sounds. No organomegaly. Extremities: No edema Psychiatric: alert and oriented x3. Cooperative     ASSESSMENT:   Colon cancer screening  PLAN:  Screening colonoscopy         [1] No Known Allergies

## 2024-01-29 NOTE — Patient Instructions (Signed)
 Discharge instructions given. Handouts on Hemorrhoids. Resume previous medications. YOU HAD AN ENDOSCOPIC PROCEDURE TODAY AT THE [facility]:   Refer to the procedure report that was given to you for any specific questions about what was found during the examination.  If the procedure report does not answer your questions, please call your gastroenterologist to clarify.  If you requested that your care partner not be given the details of your procedure findings, then the procedure report has been included in a sealed envelope for you to review at your convenience later.  YOU SHOULD EXPECT: Some feelings of bloating in the abdomen. Passage of more gas than usual.  Walking can help get rid of the air that was put into your GI tract during the procedure and reduce the bloating. If you had a lower endoscopy (such as a colonoscopy or flexible sigmoidoscopy) you may notice spotting of blood in your stool or on the toilet paper. If you underwent a bowel prep for your procedure, you may not have a normal bowel movement for a few days.  Please Note:  You might notice some irritation and congestion in your nose or some drainage.  This is from the oxygen used during your procedure.  There is no need for concern and it should clear up in a day or so.  SYMPTOMS TO REPORT IMMEDIATELY:  Following lower endoscopy (colonoscopy or flexible sigmoidoscopy):  Excessive amounts of blood in the stool  Significant tenderness or worsening of abdominal pains  Swelling of the abdomen that is new, acute  Fever of 100F or higher   For urgent or emergent issues, a gastroenterologist can be reached at any hour by calling [phone]. Do not use MyChart messaging for urgent concerns.    DIET:  We do recommend a small meal at first, but then you may proceed to your regular diet.  Drink plenty of fluids but you should avoid alcoholic beverages for 24 hours.  ACTIVITY:  You should plan to take it easy for the  rest of today and you should NOT DRIVE or use heavy machinery until tomorrow (because of the sedation medicines used during the test).    FOLLOW UP: Our staff will call the number listed on your records the next business day following your procedure.  We will call around 7:15- 8:00 am to check on you and address any questions or concerns that you may have regarding the information given to you following your procedure. If we do not reach you, we will leave a message.     If any biopsies were taken you will be contacted by phone or by letter within the next 1-3 weeks.  Please call us  at [phone] if you have not heard about the biopsies in 3 weeks.    SIGNATURES/CONFIDENTIALITY: You and/or your care partner have signed paperwork which will be entered into your electronic medical record.  These signatures attest to the fact that that the information above on your After Visit Summary has been reviewed and is understood.  Full responsibility of the confidentiality of this discharge information lies with you and/or your care-partner.

## 2024-01-29 NOTE — Progress Notes (Signed)
 Transferred to PACU via stretcher. Patient arousing to stimulation.  VSS upon leaving procedure room.

## 2024-01-29 NOTE — Progress Notes (Signed)
 Pt's states no medical or surgical changes since previsit or office visit.

## 2024-01-29 NOTE — Op Note (Signed)
 [facility] Patient Name: Chelsea Abbott Procedure Date: 01/29/2024 10:29 AM MRN: [id number] Endoscopist: Norleen SAILOR. Abran , MD, [id number] Age: 44 Referring MD:  Date of Birth: [birth year] Gender: Female Account #: [id number] Procedure:                Colonoscopy Indications:              Screening for colorectal malignant neoplasm Medicines:                Monitored Anesthesia Care Procedure:                Pre-Anesthesia Assessment:                           - Prior to the procedure, a History and Physical                            was performed, and patient medications and                            allergies were reviewed. The patient's tolerance of                            previous anesthesia was also reviewed. The risks                            and benefits of the procedure and the sedation                            options and risks were discussed with the patient.                            All questions were answered, and informed consent                            was obtained. Prior Anticoagulants: The patient has                            taken no anticoagulant or antiplatelet agents. ASA                            Grade Assessment: II - A patient with mild systemic                            disease. After reviewing the risks and benefits,                            the patient was deemed in satisfactory condition to                            undergo the procedure.                           After obtaining informed consent, the colonoscope  was passed under direct vision. Throughout the                            procedure, the patient's blood pressure, pulse, and                            oxygen saturations were monitored continuously. The                            Olympus CF-HQ190L ([id number]) Colonoscope was                            introduced through the anus and advanced to the the                            cecum,  identified by appendiceal orifice and                            ileocecal valve. The ileocecal valve, appendiceal                            orifice, and rectum were photographed. The quality                            of the bowel preparation was excellent. The                            colonoscopy was performed without difficulty. The                            patient tolerated the procedure well. The bowel                            preparation used was SUPREP via split dose                            instruction. Scope In: 10:34:27 AM Scope Out: 10:45:45 AM Scope Withdrawal Time: 0 hours 9 minutes 21 seconds  Total Procedure Duration: 0 hours 11 minutes 18 seconds  Findings:                 The entire examined colon appeared normal on direct                            and retroflexion views. Internal hemorrhoids. Complications:            No immediate complications. Estimated blood loss:                            None. Estimated Blood Loss:     Estimated blood loss: none. Impression:               - The entire examined colon is normal on direct and  retroflexion views. Internal hemorrhoids.                           - No specimens collected. Recommendation:           - Repeat colonoscopy in 10 years for screening                            purposes.                           - Patient has a contact number available for                            emergencies. The signs and symptoms of potential                            delayed complications were discussed with the                            patient. Return to normal activities tomorrow.                            Written discharge instructions were provided to the                            patient.                           - Resume previous diet.                           - Continue present medications. Norleen SAILOR. Abran, MD 01/29/2024 10:50:12 AM This report has been signed electronically.

## 2024-01-30 ENCOUNTER — Telehealth: Payer: Self-pay

## 2024-01-30 NOTE — Telephone Encounter (Signed)
 No answer on follow-up call. Left VM for pt.

## 2024-10-08 ENCOUNTER — Ambulatory Visit: Admitting: Obstetrics and Gynecology
